# Patient Record
Sex: Female | Born: 1965 | Race: White | Hispanic: No | Marital: Married | Smoking: Current every day smoker
Health system: Southern US, Community
[De-identification: ages and names within clinical notes are randomized; demographics above are authoritative.]

## PROBLEM LIST (undated history)

## (undated) DIAGNOSIS — R9389 Abnormal findings on diagnostic imaging of other specified body structures: Secondary | ICD-10-CM

## (undated) DIAGNOSIS — E559 Vitamin D deficiency, unspecified: Secondary | ICD-10-CM

## (undated) DIAGNOSIS — I10 Essential (primary) hypertension: Secondary | ICD-10-CM

## (undated) DIAGNOSIS — J189 Pneumonia, unspecified organism: Secondary | ICD-10-CM

## (undated) DIAGNOSIS — R739 Hyperglycemia, unspecified: Secondary | ICD-10-CM

## (undated) DIAGNOSIS — F329 Major depressive disorder, single episode, unspecified: Secondary | ICD-10-CM

## (undated) DIAGNOSIS — R399 Unspecified symptoms and signs involving the genitourinary system: Secondary | ICD-10-CM

## (undated) DIAGNOSIS — Z20822 Contact with and (suspected) exposure to covid-19: Secondary | ICD-10-CM

## (undated) DIAGNOSIS — F418 Other specified anxiety disorders: Secondary | ICD-10-CM

## (undated) DIAGNOSIS — M47812 Spondylosis without myelopathy or radiculopathy, cervical region: Secondary | ICD-10-CM

## (undated) DIAGNOSIS — R079 Chest pain, unspecified: Secondary | ICD-10-CM

## (undated) DIAGNOSIS — M25551 Pain in right hip: Secondary | ICD-10-CM

## (undated) DIAGNOSIS — B001 Herpesviral vesicular dermatitis: Secondary | ICD-10-CM

## (undated) DIAGNOSIS — N390 Urinary tract infection, site not specified: Secondary | ICD-10-CM

## (undated) DIAGNOSIS — Z780 Asymptomatic menopausal state: Secondary | ICD-10-CM

## (undated) DIAGNOSIS — Z720 Tobacco use: Secondary | ICD-10-CM

## (undated) DIAGNOSIS — M5414 Radiculopathy, thoracic region: Secondary | ICD-10-CM

## (undated) DIAGNOSIS — I38 Endocarditis, valve unspecified: Secondary | ICD-10-CM

## (undated) DIAGNOSIS — R52 Pain, unspecified: Secondary | ICD-10-CM

## (undated) DIAGNOSIS — M1990 Unspecified osteoarthritis, unspecified site: Secondary | ICD-10-CM

## (undated) DIAGNOSIS — F32A Depression, unspecified: Secondary | ICD-10-CM

## (undated) HISTORY — DX: Essential (primary) hypertension: I10

## (undated) HISTORY — PX: HIP SURGERY: SHX245

## (undated) HISTORY — PX: VAGINAL HYSTERECTOMY: SUR661

## (undated) HISTORY — DX: Depression, unspecified: F32.A

## (undated) HISTORY — DX: Unspecified osteoarthritis, unspecified site: M19.90

## (undated) HISTORY — PX: ELBOW SURGERY: SHX618

---

## 2006-05-14 ENCOUNTER — Emergency Department (HOSPITAL_COMMUNITY): Admission: EM | Admit: 2006-05-14 | Discharge: 2006-05-14 | Payer: Self-pay | Admitting: Emergency Medicine

## 2006-07-26 ENCOUNTER — Encounter (INDEPENDENT_AMBULATORY_CARE_PROVIDER_SITE_OTHER): Payer: Self-pay | Admitting: Specialist

## 2006-07-26 ENCOUNTER — Ambulatory Visit (HOSPITAL_COMMUNITY): Admission: RE | Admit: 2006-07-26 | Discharge: 2006-07-26 | Payer: Self-pay | Admitting: Obstetrics and Gynecology

## 2009-06-30 ENCOUNTER — Encounter (INDEPENDENT_AMBULATORY_CARE_PROVIDER_SITE_OTHER): Payer: Self-pay | Admitting: Obstetrics and Gynecology

## 2009-06-30 ENCOUNTER — Inpatient Hospital Stay (HOSPITAL_COMMUNITY): Admission: RE | Admit: 2009-06-30 | Discharge: 2009-07-02 | Payer: Self-pay | Admitting: Obstetrics and Gynecology

## 2010-05-12 ENCOUNTER — Encounter
Admission: RE | Admit: 2010-05-12 | Discharge: 2010-05-12 | Payer: Self-pay | Source: Home / Self Care | Attending: Family Medicine | Admitting: Family Medicine

## 2010-08-02 LAB — COMPREHENSIVE METABOLIC PANEL
AST: 18 U/L (ref 0–37)
BUN: 8 mg/dL (ref 6–23)
Chloride: 108 mEq/L (ref 96–112)
GFR calc Af Amer: >60 mL/min (ref >60)
GFR calc non Af Amer: >60 mL/min (ref >60)
Potassium: 4.2 mEq/L (ref 3.5–5.1)
Sodium: 140 mEq/L (ref 135–145)
Total Bilirubin: 0.5 mg/dL (ref 0.3–1.2)
Total Protein: 7.4 g/dL (ref 6.0–8.3)

## 2010-08-02 LAB — CBC
HCT: 30.9 % — ABNORMAL LOW (ref 36.0–46.0)
Hemoglobin: 14 g/dL (ref 12.0–15.0)
MCHC: 34.2 g/dL (ref 30.0–36.0)
MCHC: 34.5 g/dL (ref 30.0–36.0)
MCV: 98.7 fL (ref 78.0–100.0)
Platelets: 294 10*3/uL (ref 150–400)
Platelets: 360 10*3/uL (ref 150–400)
RDW: 12.1 % (ref 11.5–15.5)
RDW: 12.6 % (ref 11.5–15.5)
WBC: 8.8 10*3/uL (ref 4.0–10.5)

## 2010-08-02 LAB — HEMOGLOBIN AND HEMATOCRIT, BLOOD
HCT: 34.5 % — ABNORMAL LOW (ref 36.0–46.0)
Hemoglobin: 11.6 g/dL — ABNORMAL LOW (ref 12.0–15.0)
Hemoglobin: 13.6 g/dL (ref 12.0–15.0)

## 2010-08-02 LAB — URINALYSIS, ROUTINE W REFLEX MICROSCOPIC
Bilirubin Urine: NEGATIVE
Hgb urine dipstick: NEGATIVE
Specific Gravity, Urine: 1.02 (ref 1.005–1.030)
pH: 6 (ref 5.0–8.0)

## 2010-08-02 LAB — PREGNANCY, URINE: Preg Test, Ur: NEGATIVE

## 2010-08-02 LAB — PROTIME-INR: Prothrombin Time: 12.2 seconds (ref 11.6–15.2)

## 2010-09-29 NOTE — Op Note (Signed)
NAMEFERNANDE, Autumn Murray NO.:  [id number]   MEDICAL RECORD NO.:  [id number]          PATIENT TYPE:  AMB   LOCATION:  SDC                           FACILITY:  [facility]   PHYSICIAN:  Miguel Aschoff, M.D.       DATE OF BIRTH:  1965/12/23   DATE OF PROCEDURE:  07/26/2006  DATE OF DISCHARGE:                               OPERATIVE REPORT   PREOPERATIVE DIAGNOSES:  1. Chronic right lower quadrant pain.  2. Complex right adnexal mass.   POSTOPERATIVE DIAGNOSES:  1. Chronic right lower quadrant pain.  2. Complex right adnexal mass.  3. Multiple pelvic adhesions.   PROCEDURES:  1. Diagnostic laparoscopy.  2. Lysis of adhesions.  3. Right salpingo-oophorectomy.  4. Removal of Filshie clips.   SURGEON:  Miguel Aschoff, MD   ASSISTANT:  Freddrick March. Tenny Craw, MD   ANESTHESIA:  General.   COMPLICATIONS:  None.   JUSTIFICATION:  The patient is a 45 year old white female who has a  history of persistent pelvic pain.  The patient previously underwent a  right salpingectomy in an effort to control her pain, however has had  continued symptoms without relief.  She is noted to have right hip pain,  felt not to be associated with the problem in the pelvis.  Ultrasound  examination has been carried out on this patient and reveals what  appears to be a complex right adnexal mass involving the ovary with  calcifications and suspected to be a dermoid.  She presents now to  undergo laparoscopy and possible laparotomy to assess the etiology of  this pain and correct it.  The risks and benefits have been discussed  with the patient.   PROCEDURE:  The patient was taken to the operating room and placed in a  supine position and general anesthesia was administered without  difficulty.  She is then placed in the dorsal lithotomy position,  prepped and draped in the usual sterile fashion.  After this was done,  the bladder was catheterized and a Hulka tenaculum was placed through  the cervix and  held.  At this point a small infraumbilical incision was  made, a Veress needle was inserted, and the abdomen was insufflated with  3 L of CO2.  Following this the trocar to the laparoscope was placed,  followed by the laparoscope itself.  Then two accessory were ports were  established in the right and left lower quadrants under direct  visualization.  After this was done, systematic inspection of the pelvis  and abdomen was carried out.  The anterior bladder peritoneum was  unremarkable.  The uterus was normal size and shape.  Its surface was  unremarkable.  The cul-de-sac was unremarkable.  On the left side the  residual portion of the left tube appeared be within normal limits  proximally.  A Filshie clip was noted to be proximal.  The left ovary  appeared to be totally within normal limits.  No adhesions were noted.  On the right side, however, there was a complex mass on the right  involving the right ovary to the appendices epiploica  of the sigmoid  colon.  Placing the appendices epiploica on stretch, using sharp  dissection it was possible to free these off the surface of the ovary.  Then using the same careful dissection, it was possible to free the  ovary off the right lateral pelvic sidewall.  After this was done, the  Gyrus unit was introduced and the pedicle holding the right ovary was  then grasped, cauterized and cut, freeing the specimen.  There was good  hemostasis in the area this dissection.  The ovary was then placed in  the cul-de-sac.  In an effort to remove any other source of pain, the  previously-placed Filshie clips were grasped, elevated, and then using  the Gyrus unit the tissue below these clips was cauterized and cut and  the clips were removed.  Specimens were then placed in the cul-de-sac.  The right lower quadrant 5 mm port was enlarged 11 mm.  An EndoCatch bag  was placed and the specimen of the right ovary and the two Filshie clips  was placed in the  EndoCatch bag and removed through the right lower  quadrant.  At this point inspection made for hemostasis.  Hemostasis  appeared to be excellent.  With no other problems being noted, it was  elected to complete the procedure.  The CO2 was allowed to escape.  11  mm port site was then identified, the fascia grasped and closed using  figure-of-eight sutures of 0 Vicryl.  Subcutaneous tissue in this area  was closed using a single suture of 0 Vicryl and then the residual port  sites were closed using subcuticular 4-0 Vicryl.  Steri-Strips were  applied.  Port sites were then injected 0.25% Marcaine.  A total of 10  mL were used.   The patient tolerated the procedure well and was taken to the recovery  room in satisfactory condition.  The blood loss was minimal.  Plan is  for the patient be discharged home.  Medications for home include  Vicodin one every 3 hours need for pain.  She is to continue her  sertraline 50 mg daily and add naproxen as needed.  She is instructed no  heavy lifting, to refrain from sex for 2 weeks.  The patient will be  seen back in 4 weeks for follow-up examination and is to call for any  problems such as fever, pain or heavy bleeding.      Miguel Aschoff, M.D.  Electronically Signed     AR/MEDQ  D:  07/26/2006  T:  07/26/2006  Job:  [id number]

## 2015-02-01 ENCOUNTER — Encounter: Payer: BLUE CROSS/BLUE SHIELD | Attending: Orthopaedic Surgery of the Spine

## 2015-03-17 ENCOUNTER — Ambulatory Visit: Admit: 2015-03-17 | Discharge: 2015-03-25 | Payer: BLUE CROSS/BLUE SHIELD | Attending: Surgical

## 2015-03-17 DIAGNOSIS — M5136 Other intervertebral disc degeneration, lumbar region: Secondary | ICD-10-CM

## 2015-03-17 NOTE — Progress Notes (Signed)
History of present illness:   Ms. Tanya Velasquez  is a pleasant 49 y.o. female with no significant PMH kindly referred by Dr. Mirian Mowight Mosley for consultation regarding her LBP and right leg pain. She states her pain began insidiously about 10  years ago. Her pain has steadily increased since then. She rates her back pain 5/10 and leg pain 3/10. She describes the pain as aching. Her pain originates in her low back and intermittently radiates into the lateral aspect of her right leg to her knee.  She denies numbness and tingling in her legs.  She denies weakness of her legs and denies bowel or bladder dysfunction. She states she can sit as long as she likes and stand for a maximum of 1 hour. The pain moderately disrupts her sleep. She has tried pain management, epidural injections, RFA, PT and chiropractic therapy with temporary relief.  She takes Percocet, NSAIDs and Flexeril. [place] and notes her previous injections were 1 year ago by her PM physician there. She has not tried any injections with Dr. Excell SeltzerMosley.    Past medical history:  Her past medical history has been reviewed.    Past surgical history:  Her past surgical history has been reviewed and is non-contributory to her present illness.    Her medications and allergies were reviewed.    Social history:  Her social history has been reviewed and she smokes 1/2 ppd for 20 years.      Review of symptoms:  Patient's review of symptoms was reviewed and is significant for back pain and negative for recent weight loss, fatigue, chills, visual disturbances, blood in stool or urine, recent infection, chest pain, or shortness of breath. All other ROS were negative.    Physical examination:  Ms. Tanya Velasquez's most recent vitals:  Vitals  Height: 5\' 5"  (165.1 cm)  Weight: 155 lb (70.3 kg)  Body mass index is 25.79 kg/(m^2).    General exam:  She is well-developed and well-nourished, is in obvious pain and alert and oriented to person,  place, and time. She demonstrates appropriate mood and affect. Her skin is warm and dry. Her gait is normal and she walks heel to toe without limp or instability.     Back:  She stands with slight lumbar flexion. Her lumbar flexion, extension and lateral bending are moderately reduced with pain. She has mild tenderness over her lumbar spine without obvious muscle spasm. The skin over her lumbar spine is normal without a surgical scar.     Lower extremities:  She has 5/5 motor strength of bilateral lower extremities. She has a negative straight leg raise, bilaterally.  Deep tendon reflexes at knees and achilles are 2+. Sensation is intact to light touch L3 to S1 bilaterally. She has no clonus. Hip range of motion painless.    Abdomen:  Non-tender and non-distended.  Abdomen is not obese. No rash.    Imaging:  I reviewed MRI images of her lumbar spine from 10/2013 (report not available). They note mild degenerative changes with small annular tear at L5S1 and small right lateral disc protrusion at L45. No severe stenosis or neural impingement noted.    Assessment:  Lumbar spondylosis    Plan:  We discussed treatment options including observation, physical therapy, additional epidural injections or nerve blocks.  I recommend she continue with pain management and periodic injections. I do not think spinal surgery is indicated at this time.    Note dictated by Tresa EndoKelly  Rochester Serpe PA-C, patient also seen and examined by Dr. Phineas Inches.

## 2015-07-22 ENCOUNTER — Inpatient Hospital Stay
Admit: 2015-07-22 | Discharge: 2015-07-22 | Disposition: A | Discharge status: Home / Self Care | Attending: Emergency Medicine

## 2015-07-22 DIAGNOSIS — F1193 Opioid use, unspecified with withdrawal: Secondary | ICD-10-CM

## 2015-07-22 MED ORDER — CLONIDINE HCL 0.1 MG PO TABS
0.1 MG | ORAL_TABLET | Freq: Two times a day (BID) | ORAL | 0 refills | Status: DC
Start: 2015-07-22 — End: 2017-12-05

## 2015-07-22 MED ORDER — PROMETHAZINE HCL 25 MG PO TABS
25 MG | ORAL_TABLET | ORAL | 0 refills | Status: AC
Start: 2015-07-22 — End: 2015-07-27

## 2015-07-22 MED ORDER — CLONIDINE HCL 0.1 MG PO TABS
0.1 MG | Freq: Once | ORAL | Status: AC
Start: 2015-07-22 — End: 2015-07-22
  Administered 2015-07-22: 16:00:00 0.1 mg via ORAL

## 2015-07-22 MED FILL — CLONIDINE HCL 0.1 MG PO TABS: 0.1 MG | ORAL | Qty: 1

## 2015-07-22 NOTE — ED Provider Notes (Signed)
HPI:  07/22/15, Time: 9:32 AM         Ellis SavageColleen Thall is a 50 y.o. female presenting to the ED for opiate withdrawal, beginning 1 day ago.  The complaint has been persistent, moderate in severity, and worsened by nothing.  Patient states she moved to [place] several months ago she is in pain management. She states they recently decreased her pain medication and she is now out she last took a dose of narcotics 1 day ago. She is on 15 mg 4 times a day of oxycodone. She states she has some mild nausea. No diarrhea no fever no chills. She's feeling mildly anxious. There is no depression. No suicidal nor homicidal ideation.    ROS:   Pertinent positives and negatives are stated within HPI, all other systems reviewed and are negative.  --------------------------------------------- PAST HISTORY ---------------------------------------------  Past Medical History:  has a past medical history of Hypertension.    Past Surgical History:  has no past surgical history on file.    Social History:  reports that she has been smoking Cigarettes.  She started smoking about 21 years ago. She has been smoking about 0.50 packs per day. She has never used smokeless tobacco. She reports that she drinks alcohol.    Family History: family history is not on file.     The patient???s home medications have been reviewed.    Allergies: Review of patient's allergies indicates no known allergies.    ---------------------------------------------------PHYSICAL EXAM--------------------------------------     Constitutional/General: Alert and oriented x3, well appearing, non toxic in NAD  Head: Normocephalic and atraumatic  Eyes: PERRL, EOMI  Mouth: Oropharynx clear, handling secretions, no trismus  Neck: Supple, full ROM, non tender to palpation in the midline, no stridor, no crepitus, no meningeal signs  Pulmonary: Lungs clear to auscultation bilaterally, no wheezes, rales, or rhonchi. Not in respiratory distress  Cardiovascular:  Tachycardic rate.  Regular rhythm. No murmurs, gallops, or rubs. 2+ distal pulses  Chest: no chest wall tenderness  Abdomen: Soft.  Non tender. Non distended.  +BS.  No rebound, guarding, or rigidity. No pulsatile masses appreciated.  Musculoskeletal: Moves all extremities x 4. Warm and well perfused, no clubbing, cyanosis, or edema. Capillary refill <3 seconds  Skin: warm and dry. No rashes.   Neurologic: GCS 15, CN 2-12 grossly intact, no focal deficits, symmetric strength 5/5 in the upper and lower extremities bilaterally  Psych: Normal Affect. No suicidal nor homicidal ideation.    -------------------------------------------------- RESULTS -------------------------------------------------  I have personally reviewed all laboratory and imaging results for this patient. Results are listed below.     LABS:  No results found for this visit on 07/22/15.    RADIOLOGY:  Interpreted by Radiologist.  No orders to display             ------------------------- NURSING NOTES AND VITALS REVIEWED ---------------------------   The nursing notes within the ED encounter and vital signs as below have been reviewed by myself.  Visit Vitals   ??? BP (!) 142/92   ??? Pulse 116   ??? Temp 97.9 ??F (36.6 ??C) (Oral)   ??? Resp 15   ??? Ht 5\' 6"  (1.676 m)   ??? Wt 155 lb (70.3 kg)   ??? SpO2 99%   ??? BMI 25.02 kg/m2     Oxygen Saturation Interpretation: Normal    The patient???s available past medical records and past encounters were reviewed.        ------------------------------ ED COURSE/MEDICAL DECISION MAKING----------------------  Medications  cloNIDine (CATAPRES) tablet 0.1 mg (not administered)             Medical Decision Making:    At present for the opiate withdrawal patient is not suicidal nor homicidal. I advised her that due to state laws I could not describe any narcotics she has appointment to see her pain management doctor Monday. I did place her on clonidine and Phenergan for 4 days until she can see her pain management doctor. I have ordered a clonidine  tablet to be administered at this time.            This patient's ED course included: re-evaluation prior to disposition and a personal history and physicial eaxmination    This patient has remained hemodynamically stable and improved during their ED course.    Counseling:   The emergency provider has spoken with the patient and discussed today???s results, in addition to providing specific details for the plan of care and counseling regarding the diagnosis and prognosis.  Questions are answered at this time and they are agreeable with the plan.       --------------------------------- IMPRESSION AND DISPOSITION ---------------------------------    IMPRESSION  1. Opiate withdrawal (HCC)        DISPOSITION  Disposition: Discharge to home  Patient condition is good        NOTE: This report was transcribed using voice recognition software. Every effort was made to ensure accuracy; however, inadvertent computerized transcription errors may be present         Nuala Alpha, MD  07/22/15 [phone]

## 2017-12-05 ENCOUNTER — Ambulatory Visit: Admit: 2017-12-05 | Discharge: 2017-12-05 | Payer: BLUE CROSS/BLUE SHIELD | Attending: Registered Nurse

## 2017-12-05 DIAGNOSIS — Z7689 Persons encountering health services in other specified circumstances: Secondary | ICD-10-CM

## 2017-12-05 MED ORDER — VALACYCLOVIR HCL 1 G PO TABS
1 g | ORAL_TABLET | ORAL | 1 refills | Status: DC
Start: 2017-12-05 — End: 2018-11-25

## 2017-12-05 MED ORDER — DICLOFENAC SODIUM 75 MG PO TBEC
75 MG | ORAL_TABLET | Freq: Two times a day (BID) | ORAL | 5 refills | Status: DC
Start: 2017-12-05 — End: 2018-06-02

## 2017-12-05 MED ORDER — ESCITALOPRAM OXALATE 10 MG PO TABS
10 MG | ORAL_TABLET | Freq: Every day | ORAL | 5 refills | Status: DC
Start: 2017-12-05 — End: 2018-01-30

## 2017-12-05 MED ORDER — KETOROLAC TROMETHAMINE 60 MG/2ML IM SOLN
60 MG/2ML | Freq: Once | INTRAMUSCULAR | Status: AC
Start: 2017-12-05 — End: 2017-12-05
  Administered 2017-12-05: 14:00:00 60 mg via INTRAMUSCULAR

## 2017-12-05 NOTE — Progress Notes (Signed)
[facility]  [facility]  [address]  [place] [facility] [address]  Dept: [phone]  Dept Fax: [phone]  Loc: [phone]    Tanya Velasquez is a 52 y.o. female who presents today for her medical conditions/complaints as noted below.  Tanya Velasquez is c/o of Establish Care (pt is here to establish care.)        HPI:     Chief Complaint   Patient presents with   ??? Establish Care     pt is here to establish care.       HPI    Tanya Velasquez presents to the office to establish care.  Tanya Velasquez states Tanya Velasquez has not had a primary care provider for several years.  Tanya Velasquez use to live in [place] and moved to [place] several years ago and never saw a PCP yet.      Tanya Velasquez has a PMH of depression, tobacco abuse, chronic back pain and right hip pain.  Tanya Velasquez has an intrathecal pain pump in her back.  Tanya Velasquez sees Dr. Cline Crock for chronic pain. Tanya Velasquez is not on any medications for her high blood pressure.     Tanya Velasquez is interested in getting a mammogram.  Tanya Velasquez wants a 3D mammogram as Tanya Velasquez states Tanya Velasquez has been told from previous mammograms Tanya Velasquez has dense breast.  Her mother had breast cancer and died at the age of 89 from it.  Her sister is battling breast cancer now at the age of 61.        Tanya Velasquez is currently having thoracic back and neck pain.  Tanya Velasquez has had this for several months and has been taking ibuprofen.  Tanya Velasquez states the ibuprofen does not seem to help much.  Tanya Velasquez states the pain is dull and aching and when Tanya Velasquez moves it is sharp and shooting.  Tanya Velasquez has not had any x-rays performed.  No PT/OT.      Tanya Velasquez is a smoker. Tanya Velasquez smokes half a pack a day. Tanya Velasquez states Tanya Velasquez will stop smoking, but Tanya Velasquez is not interested at this time.    States that Tanya Velasquez feels like her depression is not where it should be. States with her sister recently having cancer and her husband recently having cancer, her depression worse. Tanya Velasquez feels anxious frequently. Tanya Velasquez states Tanya Velasquez is to be on Effexor and this seemed to help her a lot. Tanya Velasquez states that the Effexor seemed  to wear off though. Tanya Velasquez denies any thoughts of hurting herself or others. Tanya Velasquez admits to having constant racing thoughts in her head. Tanya Velasquez is always tired. Tanya Velasquez is wanting to try something for her depression and anxiety.    Tanya Velasquez admits to getting cold sores on her lips frequently. Tanya Velasquez notices more when Tanya Velasquez is stressed. Tanya Velasquez is asking for something to help with her cold sores when Tanya Velasquez gets them. Tanya Velasquez's tried Abreva without success.    Past Medical History:   Diagnosis Date   ??? Chronic back pain    ??? Chronic hip pain 2009   ??? Depression    ??? Osteoarthritis    ??? Substance abuse (HCC)     Tobacco abuse       Past Surgical History:   Procedure Laterality Date   ??? BACK SURGERY  05/2017    pt has an intrathecal pain pump implanted in her back.    ??? ELBOW SURGERY Left     Childhood    ??? HYSTERECTOMY, VAGINAL     ??? TOTAL HIP ARTHROPLASTY  Right 2009       Family History   Problem Relation Age of Onset   ??? Breast Cancer Mother    ??? Heart Attack Father    ??? High Blood Pressure Father    ??? High Cholesterol Father    ??? Breast Cancer Sister        Social History     Tobacco Use   ??? Smoking status: Current Every Day Smoker     Packs/day: 0.50     Years: 23.00     Pack years: 11.50     Types: Cigarettes     Start date: 05/14/1994   ??? Smokeless tobacco: Never Used   Substance Use Topics   ??? Alcohol use: Yes     Alcohol/week: 12.0 standard drinks     Types: 12 Cans of beer per week     Comment: occ         Current Outpatient Medications   Medication Sig Dispense Refill   ??? sodium chloride 0.9 % SOLN with HYDROmorphone HCl PF 50 MG/5ML SOLN 0.2 mg/mL Infuse 0.5 mg/hr intravenously continuous. Indications: pt has pain pump     ??? escitalopram (LEXAPRO) 10 MG tablet Take 1 tablet by mouth daily 30 tablet 5   ??? diclofenac (VOLTAREN) 75 MG EC tablet Take 1 tablet by mouth 2 times daily 60 tablet 5   ??? valACYclovir (VALTREX) 1 g tablet Take 2 tablets in the AM and PM once at the onset of a cold sore 30 tablet 1   ??? cyclobenzaprine (FLEXERIL) 10 MG  tablet   2     No current facility-administered medications for this visit.      Allergies   Allergen Reactions   ??? Gabapentin      Other reaction(s): Headaches       Subjective:      Review of Systems   Constitutional: Positive for fatigue. Negative for activity change, appetite change, chills, diaphoresis, fever and unexpected weight change.   HENT: Negative.  Negative for ear pain, rhinorrhea, sinus pressure, sneezing, sore throat and trouble swallowing.    Eyes: Negative for photophobia, pain, discharge, redness, itching and visual disturbance.   Respiratory: Negative.  Negative for apnea, cough, choking, chest tightness, shortness of breath, wheezing and stridor.    Cardiovascular: Negative for chest pain, palpitations and leg swelling.   Gastrointestinal: Negative.  Negative for abdominal pain, blood in stool, constipation, diarrhea, nausea and vomiting.   Genitourinary: Negative.  Negative for decreased urine volume, difficulty urinating, dysuria, enuresis, flank pain, frequency, genital sores, hematuria and urgency.   Musculoskeletal: Positive for arthralgias, back pain, myalgias, neck pain and neck stiffness. Negative for gait problem and joint swelling.   Skin: Negative.  Negative for color change, pallor, rash and wound.   Allergic/Immunologic: Negative.    Neurological: Negative for dizziness, facial asymmetry, weakness, light-headedness and headaches.   Psychiatric/Behavioral: Positive for dysphoric mood. Negative for agitation, behavioral problems, confusion, decreased concentration, hallucinations, self-injury, sleep disturbance and suicidal ideas. The patient is nervous/anxious. The patient is not hyperactive.          Objective:     Vitals:    12/05/17 0824 12/05/17 0828 12/05/17 0833   BP: (!) 132/94 (!) 142/96 128/89   Site: Right Upper Arm Right Upper Arm Right Upper Arm   Position: Sitting Sitting Sitting   Cuff Size: Medium Adult Medium Adult Medium Adult   Pulse: 102     SpO2: 98%     Weight:  178  lb (80.7 kg)     Height: 5\' 6"  (1.676 m)       Wt Readings from Last 3 Encounters:   12/05/17 178 lb (80.7 kg)   07/22/15 155 lb (70.3 kg)   03/17/15 155 lb (70.3 kg)     Temp Readings from Last 3 Encounters:   07/22/15 97.9 ??F (36.6 ??C) (Oral)     BP Readings from Last 3 Encounters:   12/05/17 128/89   07/22/15 (!) 142/92   03/17/15 (!) 152/91     Pulse Readings from Last 3 Encounters:   12/05/17 102   07/22/15 116   03/17/15 104     Physical Exam   Constitutional: Tanya Velasquez is oriented to person, place, and time. Tanya Velasquez appears well-developed and well-nourished. No distress.   HENT:   Head: Normocephalic and atraumatic.       Right Ear: External ear normal.   Left Ear: External ear normal.   Nose: Nose normal.   Mouth/Throat: Oropharynx is clear and moist. No oropharyngeal exudate.   Eyes: Conjunctivae and EOM are normal. Right eye exhibits no discharge. Left eye exhibits no discharge. No scleral icterus.   Neck: Normal range of motion. Neck supple. No JVD present. No tracheal deviation present. No thyromegaly present.   Cardiovascular: Normal rate, regular rhythm, normal heart sounds and intact distal pulses. Exam reveals no gallop and no friction rub.   No murmur heard.  Pulmonary/Chest: Effort normal and breath sounds normal. No stridor. No respiratory distress. Tanya Velasquez has no wheezes. Tanya Velasquez has no rales. Tanya Velasquez exhibits no tenderness.   Abdominal: Soft. Bowel sounds are normal. Tanya Velasquez exhibits no distension and no mass. There is no tenderness. There is no rebound and no guarding. No hernia.   Musculoskeletal: Tanya Velasquez exhibits no edema or deformity.        Right hip: Tanya Velasquez exhibits decreased range of motion, decreased strength and tenderness.        Lumbar back: Tanya Velasquez exhibits decreased range of motion, tenderness and pain.   Lymphadenopathy:     Tanya Velasquez has no cervical adenopathy.   Neurological: Tanya Velasquez is alert and oriented to person, place, and time. Tanya Velasquez has normal reflexes. Tanya Velasquez displays normal reflexes. No cranial nerve deficit. Tanya Velasquez  exhibits normal muscle tone. Coordination normal.   Skin: Skin is warm and dry. Capillary refill takes less than 2 seconds. No rash noted. Tanya Velasquez is not diaphoretic. No erythema. No pallor.   Psychiatric: Her behavior is normal. Judgment and thought content normal. Her speech is tangential. Tanya Velasquez exhibits a depressed mood.   Nursing note and vitals reviewed.      No results found for any previous visit.           Assessment & Plan:     The following diagnoses and conditions are stable with no further orders unless indicated:  1. Encounter to establish care    2. Chronic bilateral thoracic back pain    3. Right hip pain    4. Chronic bilateral low back pain with right-sided sciatica    5. Reactive depression    6. Cold sore    7. Screening for disorder of blood and blood-forming organs    8. Encounter for vitamin deficiency screening    9. Screening for cholesterol level    10. Screening for thyroid disorder    11. Screening for HIV (human immunodeficiency virus)    12. Need for hepatitis C screening test    13. Screen for colon cancer    14. Screening for  breast cancer    15. Need for Tdap vaccination    16. Chronic pain syndrome    17. Family history of breast cancer    18. Neck pain        Tanya Velasquez was seen today for establish care.    Toradol injection given today for her thoracic back pain, right hip pain, and neck pain.  Xrays ordered for her neck and thoracic back.  Tanya Velasquez is not to take any NSAIDS for the next 48 hours.  Tanya Velasquez may start on Diclofenac for her pain.  Tanya Velasquez is not to take any NSAIDS other than Diclofenac. Tanya Velasquez most likely will need a referral to orthopedic and PT/OT.    Valtrex prescribed for cold sores Tanya Velasquez may take at the onset.      Order placed for mammogram.  Recommend genetic counseling to [facility].  Referral given.      Lexapro given for depression and anxiety.  Educated her on Lexapro and depression and anxiety.     Diagnoses and all orders for this visit:    Encounter to establish care    Chronic bilateral  thoracic back pain  -     XR THORACIC SPINE (2 VIEWS); Future    Right hip pain  -     diclofenac (VOLTAREN) 75 MG EC tablet; Take 1 tablet by mouth 2 times daily  -     ketorolac (TORADOL) injection 60 mg    Chronic bilateral low back pain with right-sided sciatica  -     diclofenac (VOLTAREN) 75 MG EC tablet; Take 1 tablet by mouth 2 times daily  -     ketorolac (TORADOL) injection 60 mg    Reactive depression  -     escitalopram (LEXAPRO) 10 MG tablet; Take 1 tablet by mouth daily    Cold sore  -     valACYclovir (VALTREX) 1 g tablet; Take 2 tablets in the AM and PM once at the onset of a cold sore    Screening for disorder of blood and blood-forming organs  -     CBC Auto Differential; Future  -     Comprehensive Metabolic Panel; Future    Encounter for vitamin deficiency screening  -     Vitamin D 25 Hydroxy; Future  -     Vitamin B12 & Folate; Future    Screening for cholesterol level  -     Lipid Panel; Future    Screening for thyroid disorder  -     TSH with Reflex; Future    Screening for HIV (human immunodeficiency virus)  -     HIV Screen; Future    Need for hepatitis C screening test  -     Hepatitis C Antibody; Future    Screen for colon cancer  -     [facility]old Harms, MD, Gastro[facility], [place]    Screening for breast cancer  -     MAM TOMO DIGITAL SCREEN BILATERAL; Future    Need for Tdap vaccination  -     Tdap (age 72y and older) IM (Boostrix)    Chronic pain syndrome    Family history of breast cancer  -     External Referral To Genetics    Neck pain  -     XR CERVICAL SPINE (4-5 VIEWS); Future      Prior to Visit Medications    Medication Sig Taking? Authorizing Provider   sodium chloride 0.9 % SOLN with HYDROmorphone HCl PF  50 MG/5ML SOLN 0.2 mg/mL Infuse 0.5 mg/hr intravenously continuous. Indications: pt has pain pump Yes Historical Provider, MD   escitalopram (LEXAPRO) 10 MG tablet Take 1 tablet by mouth daily Yes Dierdre Harnessatherine D Shaunee Mulkern, APRN - CNP   diclofenac (VOLTAREN) 75  MG EC tablet Take 1 tablet by mouth 2 times daily Yes Dierdre Harnessatherine D Shadoe Cryan, APRN - CNP   valACYclovir (VALTREX) 1 g tablet Take 2 tablets in the AM and PM once at the onset of a cold sore Yes Dierdre Harnessatherine D Prudencio Velazco, APRN - CNP   cyclobenzaprine (FLEXERIL) 10 MG tablet  Yes Historical Provider, MD        Orders Placed This Encounter   Medications   ??? escitalopram (LEXAPRO) 10 MG tablet     Sig: Take 1 tablet by mouth daily     Dispense:  30 tablet     Refill:  5   ??? diclofenac (VOLTAREN) 75 MG EC tablet     Sig: Take 1 tablet by mouth 2 times daily     Dispense:  60 tablet     Refill:  5   ??? valACYclovir (VALTREX) 1 g tablet     Sig: Take 2 tablets in the AM and PM once at the onset of a cold sore     Dispense:  30 tablet     Refill:  1   ??? ketorolac (TORADOL) injection 60 mg         Return in about 8 weeks (around 01/30/2018) for BP recheck, discuss blood pressure .    Patient should call the office immediately with new or ongoing signs or symptoms or worsening, or proceedto the emergency room.  No changes in past medical history, past surgical history, social history, or family history were noted during the patient encounter unless specifically listed above.  All updates of past medicalhistory, past surgical history, social history, or family history were reviewed personally by me during the office visit.  All problems listed in the assessment are stable unless noted otherwise.  Medication profilereviewed personally by me during the office visit.  Medication side effects and possible impairments from medications were discussed as applicable.  ??  Call if pattern of symptoms change or persists for an extended time.    This document was prepared by a combination of typing and transcription through a voice recognition software.    This provider spent 60 minutes in the room with the patient with 50% or greater being utilized on patient education.  Patient educated on Lexapro, depression, genetic testing, mammograms,  colonoscopy, tobacco cessation, NSAIDS.     The patient was advised that NSAID-type medications have two very important potential side effects: gastrointestinal irritation including hemorrhage and renal injuries. Tanya Velasquez was asked to take the medication with food and to stop if Tanya Velasquez experiences any GI upset. I asked her to call for vomiting, abdominal pain or black/bloody stools. The patient expresses understanding of these issues and questions were answered.    I've explained to her that drugs of the SSRI class can have side effects such as weight gain, sexual dysfunction, insomnia, headache, nausea. These medications are generally effective at alleviating symptoms of anxiety and/or depression. Let me know if significant side effects do occur.    See someone right away if you want to hurt or kill yourself! -- If you ever feel like you might hurt yourself or someone else, do one of these things:  ?Call your doctor or nurse and tell them it is urgent  ?  Call for an ambulance (in the [place] and [place], dial 9-1-1)  ?Go to the emergency room at your local hospital  ?Call the National Suicide Prevention Lifeline:  ???848-571-6288    Tobacco abuse: Patient was counseled about stopping tobacco use. Discussed harmful effects of continued tobacco use including COPD, cardiovascular disease, and/or death. Discussed the increased risk of pneumonia as well as the potential for substantial decline in lung function. The following recommendations were given to improve chances of quitting:  ??  1)                   Chances of stopping improve with each attempt  ??  2)                   Encourage all other occupants in the house to quit  ??  3)                   Remove all tobacco products from home, work, and vehicles  ??  4)                   Tobacco cessation aids such as nicotine replacement therapy options    It is very important that Tanya Velasquez quit smoking. There are various alternatives available to help with this difficult task, but first and  foremost, Tanya Velasquez must make a firm commitment and decision to quit. The nature of nicotine addiction is discussed. The usefulness of behavioral therapy is discussed and suggested.  The correct use, cost and side effects of nicotine replacement therapy such as gum or patches is discussed. Bupropion and its cost (sometimes not covered fully by insurance) and side effects are reviewed. The quit rates are discussed. I recommend Tanya Velasquez not allow potential costs of treatment to deter her from using nicotine replacement therapy or bupropion, as the long term economic and health benefits are obvious.

## 2017-12-10 ENCOUNTER — Encounter: Admit: 2017-12-10 | Discharge: 2017-12-10 | Payer: BLUE CROSS/BLUE SHIELD

## 2017-12-10 LAB — CBC WITH AUTO DIFFERENTIAL
Basophils %: 1.4 %
Basophils Absolute: 0.1 10*3/uL (ref 0.0–0.2)
Eosinophils %: 4 %
Eosinophils Absolute: 0.3 10*3/uL (ref 0.0–0.6)
Hematocrit: 40.1 % (ref 36.0–48.0)
Hemoglobin: 13.6 g/dL (ref 12.0–16.0)
Lymphocytes %: 34.5 %
Lymphocytes Absolute: 3 10*3/uL (ref 1.0–5.1)
MCH: 33 pg (ref 26.0–34.0)
MCHC: 34.1 g/dL (ref 31.0–36.0)
MCV: 96.9 fL (ref 80.0–100.0)
MPV: 7.6 fL (ref 5.0–10.5)
Monocytes %: 8.6 %
Monocytes Absolute: 0.8 10*3/uL (ref 0.0–1.3)
Neutrophils %: 51.5 %
Neutrophils Absolute: 4.5 10*3/uL (ref 1.7–7.7)
Platelets: 468 10*3/uL — ABNORMAL HIGH (ref 135–450)
RBC: 4.13 M/uL (ref 4.00–5.20)
RDW: 13.6 % (ref 12.4–15.4)
WBC: 8.7 10*3/uL (ref 4.0–11.0)

## 2017-12-11 LAB — VITAMIN B12 & FOLATE
Folate: 9.55 ng/mL (ref 4.78–24.20)
Vitamin B-12: 560 pg/mL (ref 211–911)

## 2017-12-11 LAB — COMPREHENSIVE METABOLIC PANEL
ALT: 35 U/L (ref 10–40)
AST: 23 U/L (ref 15–37)
Albumin/Globulin Ratio: 1.8 (ref 1.1–2.2)
Albumin: 4.6 g/dL (ref 3.4–5.0)
Alkaline Phosphatase: 76 U/L (ref 40–129)
Anion Gap: 15 (ref 3–16)
BUN: 17 mg/dL (ref 7–20)
CO2: 24 mmol/L (ref 21–32)
Calcium: 9.7 mg/dL (ref 8.3–10.6)
Chloride: 104 mmol/L (ref 99–110)
Creatinine: 1 mg/dL (ref 0.6–1.1)
GFR African American: >60 (ref >60)
GFR Non-African American: 58 — AB (ref >60)
Globulin: 2.6 g/dL
Glucose: 87 mg/dL (ref 70–99)
Potassium: 4.7 mmol/L (ref 3.5–5.1)
Sodium: 143 mmol/L (ref 136–145)
Total Bilirubin: <0.2 mg/dL (ref 0.0–1.0)
Total Protein: 7.2 g/dL (ref 6.4–8.2)

## 2017-12-11 LAB — HIV SCREEN
HIV ANTIGEN: NONREACTIVE
HIV Ag/Ab: NONREACTIVE
HIV-1 Antibody: NONREACTIVE
HIV-2 Ab: NONREACTIVE

## 2017-12-11 LAB — LIPID PANEL
Cholesterol, Total: 255 mg/dL — ABNORMAL HIGH (ref 0–199)
HDL: 102 mg/dL — ABNORMAL HIGH (ref 40–60)
LDL Calculated: 131 mg/dL — ABNORMAL HIGH (ref <100)
Triglycerides: 112 mg/dL (ref 0–150)
VLDL Cholesterol Calculated: 22 mg/dL

## 2017-12-11 LAB — HEPATITIS C ANTIBODY: Hep C Ab Interp: NONREACTIVE

## 2017-12-11 LAB — VITAMIN D 25 HYDROXY: Vit D, 25-Hydroxy: 31.8 ng/mL (ref >=30)

## 2017-12-11 LAB — TSH WITH REFLEX: TSH: 3.68 u[IU]/mL (ref 0.27–4.20)

## 2017-12-18 NOTE — Telephone Encounter (Signed)
Pt called back and did not have information for [facility]. Information given. Pt is going to call.

## 2017-12-18 NOTE — Telephone Encounter (Signed)
-----   Message from Dierdre Harnessatherine D Carnahan, APRN - CNP sent at 12/05/2017  5:54 PM EDT -----  Please make sure patient was given referral for genetic counseling at [facility] in [place].

## 2018-01-07 ENCOUNTER — Inpatient Hospital Stay: Admit: 2018-01-07 | Payer: BLUE CROSS/BLUE SHIELD

## 2018-01-07 DIAGNOSIS — Z1231 Encounter for screening mammogram for malignant neoplasm of breast: Secondary | ICD-10-CM

## 2018-01-27 ENCOUNTER — Encounter

## 2018-01-27 NOTE — Telephone Encounter (Signed)
Pt was last seen on 12/05/17 for [facility]/ New pt

## 2018-01-27 NOTE — Telephone Encounter (Signed)
Medication should only be taken at the onset of a cold sore.  Should not need refills.

## 2018-01-30 ENCOUNTER — Ambulatory Visit: Admit: 2018-01-30 | Discharge: 2018-01-30 | Payer: BLUE CROSS/BLUE SHIELD | Attending: Registered Nurse

## 2018-01-30 DIAGNOSIS — F329 Major depressive disorder, single episode, unspecified: Secondary | ICD-10-CM

## 2018-01-30 LAB — BASIC METABOLIC PANEL
Anion Gap: 14 (ref 3–16)
BUN: 9 mg/dL (ref 7–20)
CO2: 24 mmol/L (ref 21–32)
Calcium: 9.9 mg/dL (ref 8.3–10.6)
Chloride: 104 mmol/L (ref 99–110)
Creatinine: 0.7 mg/dL (ref 0.6–1.1)
GFR African American: >60 (ref >60)
GFR Non-African American: >60 (ref >60)
Glucose: 74 mg/dL (ref 70–99)
Potassium: 5.1 mmol/L (ref 3.5–5.1)
Sodium: 142 mmol/L (ref 136–145)

## 2018-01-30 LAB — CBC WITH AUTO DIFFERENTIAL
Basophils %: 1.3 %
Basophils Absolute: 0.1 10*3/uL (ref 0.0–0.2)
Eosinophils %: 3.1 %
Eosinophils Absolute: 0.3 10*3/uL (ref 0.0–0.6)
Hematocrit: 41.5 % (ref 36.0–48.0)
Hemoglobin: 13.9 g/dL (ref 12.0–16.0)
Lymphocytes %: 28.9 %
Lymphocytes Absolute: 2.8 10*3/uL (ref 1.0–5.1)
MCH: 32.5 pg (ref 26.0–34.0)
MCHC: 33.4 g/dL (ref 31.0–36.0)
MCV: 97.2 fL (ref 80.0–100.0)
MPV: 7.8 fL (ref 5.0–10.5)
Monocytes %: 8.8 %
Monocytes Absolute: 0.8 10*3/uL (ref 0.0–1.3)
Neutrophils %: 57.9 %
Neutrophils Absolute: 5.6 10*3/uL (ref 1.7–7.7)
Platelets: 494 10*3/uL — ABNORMAL HIGH (ref 135–450)
RBC: 4.27 M/uL (ref 4.00–5.20)
RDW: 14.8 % (ref 12.4–15.4)
WBC: 9.6 10*3/uL (ref 4.0–11.0)

## 2018-01-30 MED ORDER — ESCITALOPRAM OXALATE 20 MG PO TABS
20 MG | ORAL_TABLET | Freq: Every day | ORAL | 5 refills | Status: DC
Start: 2018-01-30 — End: 2018-07-09

## 2018-01-30 NOTE — Progress Notes (Signed)
[facility]  [facility]  [address] [place] [address]  Dept: [phone]  Dept Fax: [phone]  Loc: [phone]    Tanya Velasquez is a 52 y.o. female who presents today for her medical conditions/complaints as noted below.  Tanya Velasquez is c/o of Hypertension (pt does not check bp at home. pt feels her bp has been high due to issues at home. pt is not on medication at this but has been in the past. at last specialist appt it was high. )        HPI:     Chief Complaint   Patient presents with   ??? Hypertension     pt does not check bp at home. pt feels her bp has been high due to issues at home. pt is not on medication at this but has been in the past. at last specialist appt it was high.        HPI    Ms. Tanya Velasquez presents to the office today to discuss her blood pressure.  Her blood pressure has been elevated at her pain management office.  She does not check her blood pressure at home.  She denies any chest pain, shortness of breath, leg swelling.    Patient is here to discuss anxiety/depression.  Doing well on medication.  Denies any side effects.  Would like to continue with medication, but feels like she needs an increased dose.  She has had a lot of stress lately with her marriage and her sister having breast cancer.  Denies any thought of hurting oneself or others around them.     She has chronic hip and back pain.  She is taking Diclofenac two times daily.  She is seeing pain management.      She is still smoking. She smokes 1/2 pack a day.      She is here to discuss her blood test she had done at her last office visit.     Past Medical History:   Diagnosis Date   ??? Chronic back pain    ??? Chronic hip pain 2009   ??? Depression    ??? Osteoarthritis    ??? Substance abuse ([place])     Tobacco abuse       Past Surgical History:   Procedure Laterality Date   ??? BACK SURGERY  05/2017    pt has an intrathecal pain pump implanted in her back.    ??? ELBOW SURGERY Left     Childhood     ??? HYSTERECTOMY, VAGINAL     ??? TOTAL HIP ARTHROPLASTY Right 2009       Family History   Problem Relation Age of Onset   ??? Breast Cancer Mother    ??? Heart Attack Father    ??? High Blood Pressure Father    ??? High Cholesterol Father    ??? Breast Cancer Sister        Social History     Tobacco Use   ??? Smoking status: Current Every Day Smoker     Packs/day: 0.50     Years: 23.00     Pack years: 11.50     Types: Cigarettes     Start date: 05/14/1994   ??? Smokeless tobacco: Never Used   Substance Use Topics   ??? Alcohol use: Yes     Alcohol/week: 12.0 standard drinks     Types: 12 Cans of beer per week     Comment:  occ         Current Outpatient Medications   Medication Sig Dispense Refill   ??? escitalopram (LEXAPRO) 20 MG tablet Take 1 tablet by mouth daily 30 tablet 5   ??? sodium chloride 0.9 % SOLN with HYDROmorphone HCl PF 50 MG/5ML SOLN 0.2 mg/mL Infuse 0.5 mg/hr intravenously continuous. Indications: pt has pain pump     ??? diclofenac (VOLTAREN) 75 MG EC tablet Take 1 tablet by mouth 2 times daily 60 tablet 5   ??? valACYclovir (VALTREX) 1 g tablet Take 2 tablets in the AM and PM once at the onset of a cold sore 30 tablet 1   ??? cyclobenzaprine (FLEXERIL) 10 MG tablet   2     No current facility-administered medications for this visit.      Allergies   Allergen Reactions   ??? Gabapentin      Other reaction(s): Headaches       Subjective:      Review of Systems      Objective:     Vitals:    01/30/18 0711 01/30/18 0713   BP: (!) 122/90 130/80   Site: Left Upper Arm Right Upper Arm   Position: Sitting Sitting   Cuff Size: Medium Adult Medium Adult   Pulse: 104    SpO2: 99%    Weight: 176 lb (79.8 kg)    Height: 5' 6" (1.676 m)      Wt Readings from Last 3 Encounters:   01/30/18 176 lb (79.8 kg)   12/05/17 178 lb (80.7 kg)   07/22/15 155 lb (70.3 kg)     Temp Readings from Last 3 Encounters:   07/22/15 97.9 ??F (36.6 ??C) (Oral)     BP Readings from Last 3 Encounters:   01/30/18 130/80   12/05/17 128/89   07/22/15 (!) 142/92      Pulse Readings from Last 3 Encounters:   01/30/18 104   12/05/17 102   07/22/15 116     Physical Exam   Constitutional: She is oriented to person, place, and time. She appears well-developed and well-nourished. No distress.   HENT:   Head: Normocephalic and atraumatic.   Right Ear: External ear normal.   Left Ear: External ear normal.   Nose: Nose normal.   Mouth/Throat: Oropharynx is clear and moist. No oropharyngeal exudate.   Eyes: Conjunctivae and EOM are normal. Right eye exhibits no discharge. Left eye exhibits no discharge. No scleral icterus.   Neck: Normal range of motion. Neck supple. No tracheal deviation present.   Cardiovascular: Normal rate, regular rhythm, normal heart sounds and intact distal pulses. Exam reveals no gallop and no friction rub.   No murmur heard.  Pulmonary/Chest: Effort normal and breath sounds normal. No stridor. No respiratory distress. She has no wheezes. She has no rales. She exhibits no tenderness.   Abdominal: Soft. Bowel sounds are normal. She exhibits no distension and no mass. There is no tenderness. There is no rebound and no guarding. No hernia.   Musculoskeletal: Normal range of motion. She exhibits no edema or deformity.        Cervical back: She exhibits tenderness and pain.        Lumbar back: She exhibits tenderness and pain.   Lymphadenopathy:     She has no cervical adenopathy.   Neurological: She is alert and oriented to person, place, and time. She has normal reflexes. She displays normal reflexes. No cranial nerve deficit. She exhibits normal muscle tone. Coordination normal.  Skin: Skin is warm and dry. Capillary refill takes less than 2 seconds. No rash noted. She is not diaphoretic. No erythema. No pallor.   Psychiatric: She has a normal mood and affect. Her behavior is normal. Judgment and thought content normal.   Nursing note and vitals reviewed.      Nurse Only on 12/10/2017   Component Date Value Ref Range Status   ??? Hep C Ab Interp 12/10/2017  Non-reactive  Non-reactive Final   ??? HIV Ag/Ab 12/10/2017 Non-Reactive  Non-reactive Final   ??? HIV-1 Antibody 12/10/2017 Non-Reactive  Non-reactive Final   ??? HIV ANTIGEN 12/10/2017 Non-Reactive  Non-reactive Final   ??? HIV-2 Ab 12/10/2017 Non-Reactive  Non-reactive Final   ??? TSH 12/10/2017 3.68  0.27 - 4.20 uIU/mL Final   ??? Vitamin B-12 12/10/2017 560  211 - 911 pg/mL Final   ??? Folate 12/10/2017 9.55  4.78 - 24.20 ng/mL Final    Comment: Effective 03-29-15 10:00am EST  Please note reference ranges have  changed for Folate.     ??? Vit D, 25-Hydroxy 12/10/2017 31.8  >=30 ng/mL Final    Comment: <=20 ng/mL............Marland KitchenDeficient  21-29 ng/mL..........Marland KitchenInsufficient  >=30 ng/mL.........Marland KitchenSufficient     ??? Cholesterol, Total 12/10/2017 255* 0 - 199 mg/dL Final   ??? Triglycerides 12/10/2017 112  0 - 150 mg/dL Final   ??? HDL 12/10/2017 102* 40 - 60 mg/dL Final   ??? LDL Calculated 12/10/2017 131* <100 mg/dL Final   ??? VLDL Cholesterol Calculated 12/10/2017 22  Not Established mg/dL Final   ??? Sodium 12/10/2017 143  136 - 145 mmol/L Final   ??? Potassium 12/10/2017 4.7  3.5 - 5.1 mmol/L Final   ??? Chloride 12/10/2017 104  99 - 110 mmol/L Final   ??? CO2 12/10/2017 24  21 - 32 mmol/L Final   ??? Anion Gap 12/10/2017 15  3 - 16 Final   ??? Glucose 12/10/2017 87  70 - 99 mg/dL Final   ??? BUN 12/10/2017 17  7 - 20 mg/dL Final   ??? CREATININE 12/10/2017 1.0  0.6 - 1.1 mg/dL Final   ??? GFR Non-African American 12/10/2017 58* >60 Final    Comment: >60 mL/min/1.61m EGFR, calc. for ages 178and older using the  MDRD formula (not corrected for weight), is valid for stable  renal function.     ??? GFR African American 12/10/2017 >60  >60 Final    Comment: Chronic Kidney Disease: less than 60 ml/min/1.73 sq.m.          Kidney Failure: less than 15 ml/min/1.73 sq.m.  Results valid for patients 18 years and older.     ??? Calcium 12/10/2017 9.7  8.3 - 10.6 mg/dL Final   ??? Total Protein 12/10/2017 7.2  6.4 - 8.2 g/dL Final   ??? Alb 12/10/2017 4.6  3.4 - 5.0 g/dL  Final   ??? Albumin/Globulin Ratio 12/10/2017 1.8  1.1 - 2.2 Final   ??? Total Bilirubin 12/10/2017 <0.2  0.0 - 1.0 mg/dL Final   ??? Alkaline Phosphatase 12/10/2017 76  40 - 129 U/L Final   ??? ALT 12/10/2017 35  10 - 40 U/L Final   ??? AST 12/10/2017 23  15 - 37 U/L Final   ??? Globulin 12/10/2017 2.6  g/dL Final   ??? WBC 12/10/2017 8.7  4.0 - 11.0 K/uL Final   ??? RBC 12/10/2017 4.13  4.00 - 5.20 M/uL Final   ??? Hemoglobin 12/10/2017 13.6  12.0 - 16.0 g/dL Final   ??? Hematocrit 12/10/2017 40.1  36.0 - 48.0 % Final   ??? MCV 12/10/2017 96.9  80.0 - 100.0 fL Final   ??? MCH 12/10/2017 33.0  26.0 - 34.0 pg Final   ??? MCHC 12/10/2017 34.1  31.0 - 36.0 g/dL Final   ??? RDW 12/10/2017 13.6  12.4 - 15.4 % Final   ??? Platelets 12/10/2017 468* 135 - 450 K/uL Final   ??? MPV 12/10/2017 7.6  5.0 - 10.5 fL Final   ??? Neutrophils % 12/10/2017 51.5  % Final   ??? Lymphocytes % 12/10/2017 34.5  % Final   ??? Monocytes % 12/10/2017 8.6  % Final   ??? Eosinophils % 12/10/2017 4.0  % Final   ??? Basophils % 12/10/2017 1.4  % Final   ??? Neutrophils Absolute 12/10/2017 4.5  1.7 - 7.7 K/uL Final   ??? Lymphocytes Absolute 12/10/2017 3.0  1.0 - 5.1 K/uL Final   ??? Monocytes Absolute 12/10/2017 0.8  0.0 - 1.3 K/uL Final   ??? Eosinophils Absolute 12/10/2017 0.3  0.0 - 0.6 K/uL Final   ??? Basophils Absolute 12/10/2017 0.1  0.0 - 0.2 K/uL Final           Assessment & Plan:     The following diagnoses and conditions are stable with no further orders unless indicated:  1. Reactive depression    2. Chronic pain syndrome    3. Decreased GFR    4. Abnormal CBC    5. Tobacco abuse        [place] was seen today for hypertension.    Lexapro increased from 10 to 20.  She is aware of potential side effects and would like to continue to increase the medication.  She is to call and let office know if she is interested in seeing counseling.  She is going to think about it.     Chronic pain is being managed at Dr. Ree Shay pain management.      Discussed her test results - platelets were  elevated and GFR down to 58.  Informed her that NSAIDS can cause GFR to decrease.  Will recheck blood today.  If GFR still lower than 60, she is to go off of NSAIDS.      Recommend tobacco cessation.     Diagnoses and all orders for this visit:    Reactive depression  -     escitalopram (LEXAPRO) 20 MG tablet; Take 1 tablet by mouth daily    Chronic pain syndrome    Decreased GFR  -     Basic Metabolic Panel    Abnormal CBC  -     CBC Auto Differential    Tobacco abuse      Prior to Visit Medications    Medication Sig Taking? Authorizing Provider   escitalopram (LEXAPRO) 20 MG tablet Take 1 tablet by mouth daily Yes Tarri Fuller, APRN - CNP   sodium chloride 0.9 % SOLN with HYDROmorphone HCl PF 50 MG/5ML SOLN 0.2 mg/mL Infuse 0.5 mg/hr intravenously continuous. Indications: pt has pain pump Yes Historical Provider, MD   diclofenac (VOLTAREN) 75 MG EC tablet Take 1 tablet by mouth 2 times daily Yes Tarri Fuller, APRN - CNP   valACYclovir (VALTREX) 1 g tablet Take 2 tablets in the AM and PM once at the onset of a cold sore Yes Tarri Fuller, APRN - CNP   cyclobenzaprine (FLEXERIL) 10 MG tablet  Yes Historical Provider, MD        Orders Placed This Encounter  Medications   ??? escitalopram (LEXAPRO) 20 MG tablet     Sig: Take 1 tablet by mouth daily     Dispense:  30 tablet     Refill:  5         Return in about 6 months (around 07/31/2018), or if symptoms worsen or fail to improve, for [facility] .    Patient should call the office immediately with new or ongoing signs or symptoms or worsening, or proceedto the emergency room.  No changes in past medical history, past surgical history, social history, or family history were noted during the patient encounter unless specifically listed above.  All updates of past medicalhistory, past surgical history, social history, or family history were reviewed personally by me during the office visit.  All problems listed in the assessment are stable unless  noted otherwise.  Medication profilereviewed personally by me during the office visit.  Medication side effects and possible impairments from medications were discussed as applicable.  ??  Call if pattern of symptoms change or persists for an extended time.    This document was prepared by a combination of typing and transcription through a voice recognition software.    Tobacco abuse: Patient was counseled about stopping tobacco use. Discussed harmful effects of continued tobacco use including COPD, cardiovascular disease, and/or death. Discussed the increased risk of pneumonia as well as the potential for substantial decline in lung function. The following recommendations were given to improve chances of quitting:  ??  1)                   Chances of stopping improve with each attempt  ??  2)                   Encourage all other occupants in the house to quit  ??  3)                   Remove all tobacco products from home, work, and vehicles  ??  4)                   Tobacco cessation aids such as nicotine replacement therapy options    It is very important that she quit smoking. There are various alternatives available to help with this difficult task, but first and foremost, she must make a firm commitment and decision to quit. The nature of nicotine addiction is discussed. The usefulness of behavioral therapy is discussed and suggested.  The correct use, cost and side effects of nicotine replacement therapy such as gum or patches is discussed. Bupropion and its cost (sometimes not covered fully by insurance) and side effects are reviewed. The quit rates are discussed. I recommend she not allow potential costs of treatment to deter her from using nicotine replacement therapy or bupropion, as the long term economic and health benefits are obvious.

## 2018-04-15 ENCOUNTER — Emergency Department: Admit: 2018-04-15 | Payer: Auto Insurance (includes no fault)

## 2018-04-15 ENCOUNTER — Inpatient Hospital Stay
Admit: 2018-04-15 | Discharge: 2018-04-15 | Disposition: A | Discharge status: Home / Self Care | Payer: Auto Insurance (includes no fault) | Attending: Emergency Medicine

## 2018-04-15 DIAGNOSIS — M545 Low back pain: Secondary | ICD-10-CM

## 2018-04-15 LAB — CBC WITH AUTO DIFFERENTIAL
Basophils %: 1.4 %
Basophils Absolute: 0.2 10*3/uL (ref 0.0–0.2)
Eosinophils %: 1.8 %
Eosinophils Absolute: 0.2 10*3/uL (ref 0.0–0.6)
Hematocrit: 43.1 % (ref 36.0–48.0)
Hemoglobin: 14.9 g/dL (ref 12.0–16.0)
Lymphocytes %: 22.9 %
Lymphocytes Absolute: 2.4 10*3/uL (ref 1.0–5.1)
MCH: 32.9 pg (ref 26.0–34.0)
MCHC: 34.6 g/dL (ref 31.0–36.0)
MCV: 95.1 fL (ref 80.0–100.0)
MPV: 6.8 fL (ref 5.0–10.5)
Monocytes %: 5.9 %
Monocytes Absolute: 0.6 10*3/uL (ref 0.0–1.3)
Neutrophils %: 68 %
Neutrophils Absolute: 7.3 10*3/uL (ref 1.7–7.7)
Platelets: 456 10*3/uL — ABNORMAL HIGH (ref 135–450)
RBC: 4.53 M/uL (ref 4.00–5.20)
RDW: 13.9 % (ref 12.4–15.4)
WBC: 10.7 10*3/uL (ref 4.0–11.0)

## 2018-04-15 LAB — COMPREHENSIVE METABOLIC PANEL W/ REFLEX TO MG FOR LOW K
ALT: 55 U/L — ABNORMAL HIGH (ref 10–40)
AST: 34 U/L (ref 15–37)
Albumin/Globulin Ratio: 1.3 (ref 1.1–2.2)
Albumin: 4.8 g/dL (ref 3.4–5.0)
Alkaline Phosphatase: 65 U/L (ref 40–129)
Anion Gap: 13 (ref 3–16)
BUN: 11 mg/dL (ref 7–20)
CO2: 23 mmol/L (ref 21–32)
Calcium: 10.1 mg/dL (ref 8.3–10.6)
Chloride: 103 mmol/L (ref 99–110)
Creatinine: 0.8 mg/dL (ref 0.6–1.1)
GFR African American: >60 (ref >60)
GFR Non-African American: >60 (ref >60)
Globulin: 3.6 g/dL
Glucose: 97 mg/dL (ref 70–99)
Potassium reflex Magnesium: 4.4 mmol/L (ref 3.5–5.1)
Sodium: 139 mmol/L (ref 136–145)
Total Bilirubin: <0.2 mg/dL (ref 0.0–1.0)
Total Protein: 8.4 g/dL — ABNORMAL HIGH (ref 6.4–8.2)

## 2018-04-15 MED ORDER — ACETAMINOPHEN 500 MG PO TABS
500 MG | Freq: Once | ORAL | Status: AC
Start: 2018-04-15 — End: 2018-04-15
  Administered 2018-04-15: 19:00:00 1000 mg via ORAL

## 2018-04-15 MED ORDER — SODIUM CHLORIDE 0.9 % IV BOLUS
0.9 % | Freq: Once | INTRAVENOUS | Status: AC
Start: 2018-04-15 — End: 2018-04-15
  Administered 2018-04-15: 19:00:00 1000 mL via INTRAVENOUS

## 2018-04-15 MED FILL — MAPAP 500 MG PO TABS: 500 mg | ORAL | Qty: 2
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
## 2018-04-15 NOTE — Discharge Instructions (Signed)
Take ibuprofen, up to 600 mg four times per day every day with food for 3 days, then as needed for continued pain and swelling. You may alternate this with up to 1000 mg acetaminophen (Tylenol), four times per day. Please understand, you will most likely hurt worse tomorrow than you do today.  This is normal and expected.  You should begin to gradually improve each day after that.  Please call your doctor, or return to the emergency department, if you have increasing pain, or if you develop pain in other areas of her body that were not previously painful, or if you develop persistent headaches, visual changes, nausea or vomiting, weakness, numbness, or other concerning symptoms.

## 2018-04-23 NOTE — Telephone Encounter (Signed)
Pt scheduled

## 2018-04-23 NOTE — Telephone Encounter (Signed)
Is this ok for a same day?
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
## 2018-04-23 NOTE — Telephone Encounter (Signed)
240

## 2018-04-24 ENCOUNTER — Ambulatory Visit: Payer: Auto Insurance (includes no fault)

## 2018-04-24 ENCOUNTER — Inpatient Hospital Stay: Admit: 2018-04-24 | Payer: Auto Insurance (includes no fault)

## 2018-04-24 ENCOUNTER — Ambulatory Visit: Admit: 2018-04-24 | Discharge: 2018-04-24 | Payer: Auto Insurance (includes no fault) | Attending: Registered Nurse

## 2018-04-24 DIAGNOSIS — Z09 Encounter for follow-up examination after completed treatment for conditions other than malignant neoplasm: Secondary | ICD-10-CM

## 2018-04-24 DIAGNOSIS — M542 Cervicalgia: Secondary | ICD-10-CM

## 2018-04-24 MED ORDER — PREDNISONE 10 MG PO TABS
10 MG | ORAL_TABLET | ORAL | 0 refills | Status: DC
Start: 2018-04-24 — End: 2018-05-08

## 2018-04-24 MED ORDER — IOPAMIDOL 76 % IV SOLN
76 % | Freq: Once | INTRAVENOUS | Status: AC | PRN
Start: 2018-04-24 — End: 2018-04-24
  Administered 2018-04-24: 23:00:00 75 mL via INTRAVENOUS
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
## 2018-04-24 NOTE — Patient Instructions (Addendum)
Patient Education        Whiplash: Care Instructions  Your Care Instructions  Whiplash occurs when your head is suddenly forced forward and then snapped backward, as might happen in a car accident or sports injury. This can cause pain and stiffness in your neck. Your head, chest, shoulders, and arms also may hurt.  Most whiplash gets better with home care. Your doctor may advise you to take medicine to relieve pain or relax your muscles. He or she may suggest exercise and physical therapy to increase flexibility and relieve pain. You can try wearing a neck (cervical) collar to support your neck. For a while you probably will need to avoid lifting and other activities that can strain the neck.  Follow-up care is a key part of your treatment and safety. Be sure to make and go to all appointments, and call your doctor if you are having problems. It's also a good idea to know your test results and keep a list of the medicines you take.  How can you care for yourself at home?  ?? Take pain medicines exactly as directed.  ? If the doctor gave you a prescription medicine for pain, take it as prescribed.  ? If you are not taking a prescription pain medicine, ask your doctor if you can take an over-the-counter medicine.  ? Do not take two or more pain medicines at the same time unless the doctor told you to. Many pain medicines have acetaminophen, which is Tylenol. Too much acetaminophen (Tylenol) can be harmful.  ?? You can try using a soft foam collar to support your neck for short periods of time. You can buy one at most drugstores. Do not wear the collar more than 2 or 3 days unless your doctor tells you to.  ?? You can try using heat and ice to see if it helps.  ? Try using a heating pad on a low or medium setting for 15 to 20 minutes every 2 to 3 hours. Try a warm shower in place of one session with the heating pad. You can also buy single-use heat wraps that last up to 8 hours.  ? You can also try an ice pack for 10 to  15 minutes every 2 to 3 hours.  ?? Do not do anything that makes the pain worse. Take it easy for a couple of days. You can do your usual activities if they do not hurt your neck or put it at risk for more stress or injury. Avoid lifting, sports, or other activities that might strain your neck.  ?? Try sleeping on a special neck pillow. Place it under your neck, not under your head. Placing a tightly rolled-up towel under your neck while you sleep will also work. If you use a neck pillow or rolled towel, do not use your regular pillow at the same time.  ?? Once your neck pain is gone, do exercises to stretch your neck and back and make them stronger. Your doctor or physical therapist can tell you which exercises are best.  When should you call for help?  Call 911 anytime you think you may need emergency care. For example, call if:  ?? ?? You are unable to move an arm or a leg at all.   ??Call your doctor now or seek immediate medical care if:  ?? ?? You have new or worse symptoms in your arms, legs, chest, belly, or buttocks. Symptoms may include:  ? Numbness or tingling.  ?   Weakness.  ? Pain.   ?? ?? You lose bladder or bowel control.   ??Watch closely for changes in your health, and be sure to contact your doctor if:  ?? ?? You are not getting better as expected.   Where can you learn more?  Go to [email or web address] and sign in to your MyChart account. Enter [phone] in the Search Health Information box to learn more about "Whiplash: Care Instructions."     If you do not have an account, please click on the "Sign Up Now" link.  Current as of: January 31, 2017  Content Version: 12.1  ?? 2006-2019 [employer]. Care instructions adapted under license by [facility]. If you have questions about a medical condition or this instruction, always ask your healthcare professional. [employer], [employer] disclaims any warranty or liability for your use of this information.       Patient Education         Concussion: Care Instructions  Your Care Instructions    A concussion is a kind of injury to the brain. It happens when the head receives a hard blow. The impact can jar or shake the brain against the skull. This interrupts the brain's normal activities. Although you may have cuts or bruises on your head or face, you may have no other visible signs of a brain injury. In most cases, damage to the brain from a concussion can't be seen in tests such as a CT or MRI scan.  For a few weeks, you may have low energy, dizziness, trouble sleeping, a headache, ringing in your ears, or nausea. You may also feel anxious, grumpy, or depressed. You may have problems with memory and concentration. These symptoms are common after a concussion. They should slowly improve over time. Sometimes this takes weeks or even months. Someone who lives with you should know how to care for you. Please share this and all information with a caregiver who will be available to help if needed.  Follow-up care is a key part of your treatment and safety. Be sure to make and go to all appointments, and call your doctor if you are having problems. It's also a good idea to know your test results and keep a list of the medicines you take.  How can you care for yourself at home?  Pain control  ?? Put ice or a cold pack on the part of your head that hurts for 10 to 20 minutes at a time. Put a thin cloth between the ice and your skin.  ?? Be safe with medicines. Read and follow all instructions on the label.  ? If the doctor gave you a prescription medicine for pain, take it as prescribed.  ? If you are not taking a prescription pain medicine, ask your doctor if you can take an over-the-counter medicine.  Recovery  ?? Follow your doctor's instructions. He or she will tell you if you need someone to watch you closely for the next 24 hours or longer.  ?? Rest is the best way to recover from a concussion. You need to rest your body and your brain:  ? Get plenty of  sleep at night. And take rest breaks during the day.  ? Avoid activities that take a lot of physical or mental work. This includes housework, exercise, schoolwork, video games, text messaging, and using the computer.  ? You may need to change your school or work schedule while you recover.  ? Return to your normal activities slowly.  Do not try to do too much at once.  ?? Do not drink alcohol or use illegal drugs. Alcohol and illegal drugs can slow your recovery. And they can increase your risk of a second brain injury.  ?? Avoid activities that could lead to another concussion. Follow your doctor's instructions for a gradual return to activity and sports.  ?? Ask your doctor when it's okay for you to drive a car, ride a bike, or operate machinery.  How should you return to activity?  Your return to activity can begin after 1 to 2 days of physical and mental rest. After resting, you can gradually increase your activity as long as it does not cause new symptoms or worsen your symptoms.  Doctors and concussion specialists suggest steps to follow for returning to sports after a concussion. Use these steps as a guide. You should slowly progress through the following levels of activity:  1. Limited activity. You can take part in daily activities as long as the activity doesn't increase your symptoms or cause new symptoms.  2. Light aerobic activity. This can include walking, swimming, or other exercise at less than 70% of maximum heart rate. No resistance training is included in this step.  3. Sport-specific exercise. This includes running drills or skating drills (depending on the sport), but no head impact.  4. Noncontact training drills. This includes more complex training drills such as passing. The athlete may also begin light resistance training.  5. Primary school teacher. The athlete can participate in normal training.  6. Return to normal game play. This is the final step and allows the athlete to join in normal game  play.  Watch and keep track of your progress. It should take at least 6 days for you to go from light activity to normal game play.  Make sure that you can stay at each new level of activity for at least 24 hours without symptoms, or as long as your doctor says, before doing more. If one or more symptoms come back, return to a lower level of activity for at least 24 hours. Don't move on until all symptoms are gone.  When should you call for help?  Call 911 anytime you think you may need emergency care. For example, call if:  ?? ?? You have a seizure.   ?? ?? You passed out (lost consciousness).   ?? ?? You are confused or can't stay awake.   ??Call your doctor now or seek immediate medical care if:  ?? ?? You have new or worse vomiting.   ?? ?? You feel less alert.   ?? ?? You have new weakness or numbness in any part of your body.   ??Watch closely for changes in your health, and be sure to contact your doctor if:  ?? ?? You do not get better as expected.   ?? ?? You have new symptoms, such as headaches, trouble concentrating, or changes in mood.   Where can you learn more?  Go to [email or web address] and sign in to your MyChart account. Enter [phone] in the Search Health Information box to learn more about "Concussion: Care Instructions."     If you do not have an account, please click on the "Sign Up Now" link.  Current as of: August 08, 2017  Content Version: 12.1  ?? 2006-2019 [employer]. Care instructions adapted under license by [facility]. If you have questions about a medical condition or this instruction, always ask your healthcare professional. [employer]  disclaims any warranty or liability for your use of this information.

## 2018-04-25 ENCOUNTER — Inpatient Hospital Stay: Admit: 2018-04-25 | Payer: Auto Insurance (includes no fault)

## 2018-04-25 ENCOUNTER — Encounter

## 2018-04-25 DIAGNOSIS — R9389 Abnormal findings on diagnostic imaging of other specified body structures: Secondary | ICD-10-CM

## 2018-04-25 MED ORDER — IOPAMIDOL 76 % IV SOLN
76 % | Freq: Once | INTRAVENOUS | Status: AC | PRN
Start: 2018-04-25 — End: 2018-04-25
  Administered 2018-04-25: 18:00:00 35 mL via INTRAVENOUS

## 2018-04-26 LAB — CULTURE, THROAT: Throat Culture: NORMAL

## 2018-05-08 ENCOUNTER — Ambulatory Visit: Admit: 2018-05-08 | Discharge: 2018-05-08 | Payer: Auto Insurance (includes no fault) | Attending: Registered Nurse

## 2018-05-08 DIAGNOSIS — S161XXD Strain of muscle, fascia and tendon at neck level, subsequent encounter: Secondary | ICD-10-CM

## 2018-05-08 NOTE — Progress Notes (Signed)
[facility]  [facility]  [address]  [place] [facility] [id number]  Dept: [phone]  Dept Fax: [phone]  Loc: [phone]    Tanya StableColleen S Velasquez is a 52 y.o. female who presents today for her medical conditions/complaints as noted below.  Tanya StableColleen S Velasquez is c/o of Other (pt is here to follow up on head and neck pain. She feels like she is doing better )        HPI:     Chief Complaint   Patient presents with   ??? Other     pt is here to follow up on head and neck pain. She feels like she is doing better        HPI    Tanya SideColleen presents to the office to follow up on her concussion and neck pain.  She feels like her concussion and neck pain have improved.  She feels like her headaches having improved dramatically.  She has noticed only getting 2-3 random headaches a day that only last several minutes.  She is still having some blurry vision, but states he is going to see an eye doctor.  She denies any unilateral weakness, slurred speech, confusion, abnormal gait.   She admits to her neck pain still being present, but has improved.  She feels like certain movements cause her neck to "pop."  The muscles in her back have relaxed some.  She is alternating between heat and ice.  She admits to having some left sided rib cage pain from time-to-time.  She feels like it has improved some as well with the prednisone.      She admits to noticing her heart rate increasing. She feels tired all the time, but feels like her fatigue is at baseline.  She denies any shortness of breath or coughing.  She denies palpitations.  She does smoke about 1/2 pack of cigarettes a day.     Past Medical History:   Diagnosis Date   ??? Chronic back pain    ??? Chronic hip pain 2009   ??? Depression    ??? Osteoarthritis    ??? Substance abuse (HCC)     Tobacco abuse       Past Surgical History:   Procedure Laterality Date   ??? BACK SURGERY  05/2017    pt has an intrathecal pain pump implanted in her back.    ??? ELBOW SURGERY Left      Childhood    ??? HYSTERECTOMY, VAGINAL     ??? TOTAL HIP ARTHROPLASTY Right 2009       Family History   Problem Relation Age of Onset   ??? Breast Cancer Mother    ??? Heart Attack Father    ??? High Blood Pressure Father    ??? High Cholesterol Father    ??? Breast Cancer Sister        Social History     Tobacco Use   ??? Smoking status: Current Every Day Smoker     Packs/day: 0.50     Years: 23.00     Pack years: 11.50     Types: Cigarettes     Start date: 05/14/1994   ??? Smokeless tobacco: Never Used   Substance Use Topics   ??? Alcohol use: Yes     Alcohol/week: 12.0 standard drinks     Types: 12 Cans of beer per week     Comment: occ         Current Outpatient  Medications   Medication Sig Dispense Refill   ??? escitalopram (LEXAPRO) 20 MG tablet Take 1 tablet by mouth daily 30 tablet 5   ??? sodium chloride 0.9 % SOLN with HYDROmorphone HCl PF 50 MG/5ML SOLN 0.2 mg/mL Infuse 0.5 mg/hr intravenously continuous. Indications: pt has pain pump     ??? diclofenac (VOLTAREN) 75 MG EC tablet Take 1 tablet by mouth 2 times daily 60 tablet 5   ??? valACYclovir (VALTREX) 1 g tablet Take 2 tablets in the AM and PM once at the onset of a cold sore 30 tablet 1   ??? cyclobenzaprine (FLEXERIL) 10 MG tablet   2     No current facility-administered medications for this visit.      Allergies   Allergen Reactions   ??? Gabapentin      Other reaction(s): Headaches       Subjective:      Review of Systems   Constitutional: Negative.    Respiratory: Negative.    Cardiovascular: Negative.    Musculoskeletal: Positive for arthralgias, back pain, myalgias, neck pain and neck stiffness. Negative for gait problem and joint swelling.   Skin: Negative.    Neurological: Positive for dizziness (Improving ) and headaches (Improving ). Negative for tremors, seizures, syncope, facial asymmetry, speech difficulty, weakness, light-headedness and numbness.   Psychiatric/Behavioral: Negative.          Objective:     Vitals:    05/08/18 1429 05/08/18 1521   BP: 132/80    Site:  Right Upper Arm    Position: Sitting    Cuff Size: Medium Adult    Pulse: 101 90   SpO2: 98%    Weight: 183 lb (83 kg)    Height: 5\' 6"  (1.676 m)      Wt Readings from Last 3 Encounters:   05/08/18 183 lb (83 kg)   04/24/18 182 lb (82.6 kg)   04/15/18 176 lb (79.8 kg)     Temp Readings from Last 3 Encounters:   04/15/18 99.4 ??F (37.4 ??C) (Oral)   07/22/15 97.9 ??F (36.6 ??C) (Oral)     BP Readings from Last 3 Encounters:   05/08/18 132/80   04/24/18 (!) 144/88   04/15/18 (!) 164/91     Pulse Readings from Last 3 Encounters:   05/08/18 90   04/24/18 94   04/15/18 89     Physical Exam  Vitals signs and nursing note reviewed.   Constitutional:       General: She is not in acute distress.     Appearance: Normal appearance. She is well-developed. She is not diaphoretic.   HENT:      Head: Normocephalic and atraumatic.      Right Ear: Tympanic membrane, ear canal and external ear normal. There is no impacted cerumen.      Left Ear: Tympanic membrane, ear canal and external ear normal. There is no impacted cerumen.      Nose: Nose normal. No congestion or rhinorrhea.      Mouth/Throat:      Mouth: Mucous membranes are moist.      Pharynx: Oropharynx is clear. No oropharyngeal exudate or posterior oropharyngeal erythema.   Eyes:      General: No scleral icterus.        Right eye: No discharge.         Left eye: No discharge.      Conjunctiva/sclera: Conjunctivae normal.   Neck:      Musculoskeletal: Normal range of motion  and neck supple. No neck rigidity or muscular tenderness.      Vascular: No carotid bruit.      Trachea: No tracheal deviation.   Cardiovascular:      Rate and Rhythm: Normal rate and regular rhythm.      Pulses: Normal pulses.      Heart sounds: Normal heart sounds. No murmur. No friction rub. No gallop.    Pulmonary:      Effort: Pulmonary effort is normal. No respiratory distress.      Breath sounds: Normal breath sounds. No stridor. No wheezing, rhonchi or rales.   Chest:      Chest wall: No tenderness.    Abdominal:      General: Bowel sounds are normal. There is no distension.      Palpations: Abdomen is soft. There is no mass.      Tenderness: There is no tenderness. There is no guarding or rebound.      Hernia: No hernia is present.   Musculoskeletal:         General: No swelling, deformity or signs of injury.      Cervical back: She exhibits decreased range of motion, tenderness, pain and spasm.      Lumbar back: She exhibits pain and spasm.      Right lower leg: No edema.      Left lower leg: No edema.   Lymphadenopathy:      Cervical: No cervical adenopathy.   Skin:     General: Skin is warm and dry.      Capillary Refill: Capillary refill takes less than 2 seconds.      Coloration: Skin is not jaundiced or pale.      Findings: No bruising, erythema, lesion or rash.   Neurological:      General: No focal deficit present.      Mental Status: She is alert and oriented to person, place, and time. Mental status is at baseline.      GCS: GCS eye subscore is 4. GCS verbal subscore is 5. GCS motor subscore is 6.      Cranial Nerves: Cranial nerves are intact. No cranial nerve deficit.      Sensory: Sensation is intact. No sensory deficit.      Motor: Motor function is intact. No weakness, tremor, atrophy, abnormal muscle tone, seizure activity or pronator drift.      Coordination: Coordination is intact. Romberg sign negative. Coordination normal. Finger-Nose-Finger Test and Heel to [facility] Test normal. Rapid alternating movements normal.      Gait: Gait normal.      Deep Tendon Reflexes: Reflexes are normal and symmetric. Reflexes normal.   Psychiatric:         Mood and Affect: Mood normal.         Behavior: Behavior normal.         Thought Content: Thought content normal.         Judgment: Judgment normal.         Office Visit on 04/24/2018   Component Date Value Ref Range Status   ??? Throat Culture 04/24/2018 Normal oral flora, No Beta Strep isolated   Final           Assessment & Plan:     The following diagnoses  and conditions are Velasquez with no further orders unless indicated:  1. Strain of neck muscle, subsequent encounter    2. Concussion without loss of consciousness, subsequent encounter    3. Tachycardia  4. Tobacco abuse        Metha was seen today for other.    Neck pain has improved. She may continue with her NSAID and muscle relaxer.      As far as her concussion, headaches and dizziness have improved.  She is still having blurry vision  She is to follow up with ophthalmology.  Neuro exam unremarkable.  CT scan and CTA scans reviewed.  Recommend her continue with rest and decreasing her use of electronics.     With her admitting to noticing her heart rate increasing, but not feeling palpitations, this may be related to anxiety versus caffeine and nicotine.  She admits to her sister having atrial fibrillation in her 30's.  She admits there EKGs always being normal, but is worried about her heart rate being elevated.  Echocardiogram ordered.  Will continue to monitor her BP - BP Velasquez today.  She may benefit from a beta blocker in the future.      Diagnoses and all orders for this visit:    Strain of neck muscle, subsequent encounter    Concussion without loss of consciousness, subsequent encounter    Tachycardia  -     ECHO Complete 2D W Doppler W Color; Future    Tobacco abuse      Prior to Visit Medications    Medication Sig Taking? Authorizing Provider   escitalopram (LEXAPRO) 20 MG tablet Take 1 tablet by mouth daily Yes [name], APRN - CNP   sodium chloride 0.9 % SOLN with HYDROmorphone HCl PF 50 MG/5ML SOLN 0.2 mg/mL Infuse 0.5 mg/hr intravenously continuous. Indications: pt has pain pump Yes Historical Provider, MD   diclofenac (VOLTAREN) 75 MG EC tablet Take 1 tablet by mouth 2 times daily Yes [name], APRN - CNP   valACYclovir (VALTREX) 1 g tablet Take 2 tablets in the AM and PM once at the onset of a cold sore Yes [name], APRN - CNP   cyclobenzaprine (FLEXERIL)  10 MG tablet  Yes Historical Provider, MD      No orders of the defined types were placed in this encounter.        Return in about 4 weeks (around 06/05/2018) for Echo follow up.    Patient should call the office immediately with new or ongoing signs or symptoms or worsening, or proceedto the emergency room.  No changes in past medical history, past surgical history, social history, or family history were noted during the patient encounter unless specifically listed above.  All updates of past medicalhistory, past surgical history, social history, or family history were reviewed personally by me during the office visit.  All problems listed in the assessment are Velasquez unless noted otherwise.  Medication profilereviewed personally by me during the office visit.  Medication Velasquez effects and possible impairments from medications were discussed as applicable.  ??  Call if pattern of symptoms change or persists for an extended time.    This document was prepared by a combination of typing and transcription through a voice recognition software.    This provider spent 40 minutes in the room with the patient with 50% or greater being utilized on patient education.  Patient educated on causes of tachycardia, echocardiogram, HTN, concussion, neck pain      Tobacco abuse: Patient was counseled about stopping tobacco use. Discussed harmful effects of continued tobacco use including COPD, cardiovascular disease, and/or death. Discussed the increased risk of pneumonia as well as the potential  for substantial decline in lung function. The following recommendations were given to improve chances of quitting:  ??  1)                   Chances of stopping improve with each attempt  ??  2)                   Encourage all other occupants in the house to quit  ??  3)                   Remove all tobacco products from home, work, and vehicles  ??  4)                   Tobacco cessation aids such as nicotine replacement therapy options    It  is very important that she quit smoking. There are various alternatives available to help with this difficult task, but first and foremost, she must make a firm commitment and decision to quit. The nature of nicotine addiction is discussed. The usefulness of behavioral therapy is discussed and suggested.  The correct use, cost and Velasquez effects of nicotine replacement therapy such as gum or patches is discussed. Bupropion and its cost (sometimes not covered fully by insurance) and Velasquez effects are reviewed. The quit rates are discussed. I recommend she not allow potential costs of treatment to deter her from using nicotine replacement therapy or bupropion, as the long term economic and health benefits are obvious.

## 2018-05-08 NOTE — Patient Instructions (Signed)
Patient Education        Concussion: Care Instructions  Your Care Instructions    A concussion is a kind of injury to the brain. It happens when the head receives a hard blow. The impact can jar or shake the brain against the skull. This interrupts the brain's normal activities. Although you may have cuts or bruises on your head or face, you may have no other visible signs of a brain injury. In most cases, damage to the brain from a concussion can't be seen in tests such as a CT or MRI scan.  For a few weeks, you may have low energy, dizziness, trouble sleeping, a headache, ringing in your ears, or nausea. You may also feel anxious, grumpy, or depressed. You may have problems with memory and concentration. These symptoms are common after a concussion. They should slowly improve over time. Sometimes this takes weeks or even months. Someone who lives with you should know how to care for you. Please share this and all information with a caregiver who will be available to help if needed.  Follow-up care is a key part of your treatment and safety. Be sure to make and go to all appointments, and call your doctor if you are having problems. It's also a good idea to know your test results and keep a list of the medicines you take.  How can you care for yourself at home?  Pain control  ?? Put ice or a cold pack on the part of your head that hurts for 10 to 20 minutes at a time. Put a thin cloth between the ice and your skin.  ?? Be safe with medicines. Read and follow all instructions on the label.  ? If the doctor gave you a prescription medicine for pain, take it as prescribed.  ? If you are not taking a prescription pain medicine, ask your doctor if you can take an over-the-counter medicine.  Recovery  ?? Follow your doctor's instructions. He or she will tell you if you need someone to watch you closely for the next 24 hours or longer.  ?? Rest is the best way to recover from a concussion. You need to rest your body and your  brain:  ? Get plenty of sleep at night. And take rest breaks during the day.  ? Avoid activities that take a lot of physical or mental work. This includes housework, exercise, schoolwork, video games, text messaging, and using the computer.  ? You may need to change your school or work schedule while you recover.  ? Return to your normal activities slowly. Do not try to do too much at once.  ?? Do not drink alcohol or use illegal drugs. Alcohol and illegal drugs can slow your recovery. And they can increase your risk of a second brain injury.  ?? Avoid activities that could lead to another concussion. Follow your doctor's instructions for a gradual return to activity and sports.  ?? Ask your doctor when it's okay for you to drive a car, ride a bike, or operate machinery.  How should you return to activity?  Your return to activity can begin after 1 to 2 days of physical and mental rest. After resting, you can gradually increase your activity as long as it does not cause new symptoms or worsen your symptoms.  Doctors and concussion specialists suggest steps to follow for returning to sports after a concussion. Use these steps as a guide. You should slowly progress through the   following levels of activity:  1. Limited activity. You can take part in daily activities as long as the activity doesn't increase your symptoms or cause new symptoms.  2. Light aerobic activity. This can include walking, swimming, or other exercise at less than 70% of maximum heart rate. No resistance training is included in this step.  3. Sport-specific exercise. This includes running drills or skating drills (depending on the sport), but no head impact.  4. Noncontact training drills. This includes more complex training drills such as passing. The athlete may also begin light resistance training.  5. Full-contact practice. The athlete can participate in normal training.  6. Return to normal game play. This is the final step and allows the  athlete to join in normal game play.  Watch and keep track of your progress. It should take at least 6 days for you to go from light activity to normal game play.  Make sure that you can stay at each new level of activity for at least 24 hours without symptoms, or as long as your doctor says, before doing more. If one or more symptoms come back, return to a lower level of activity for at least 24 hours. Don't move on until all symptoms are gone.  When should you call for help?  Call 911 anytime you think you may need emergency care. For example, call if:  ?? ?? You have a seizure.   ?? ?? You passed out (lost consciousness).   ?? ?? You are confused or can't stay awake.   ??Call your doctor now or seek immediate medical care if:  ?? ?? You have new or worse vomiting.   ?? ?? You feel less alert.   ?? ?? You have new weakness or numbness in any part of your body.   ??Watch closely for changes in your health, and be sure to contact your doctor if:  ?? ?? You do not get better as expected.   ?? ?? You have new symptoms, such as headaches, trouble concentrating, or changes in mood.   Where can you learn more?  Go to [email or web address] and sign in to your MyChart account. Enter Z711 in the Search Health Information box to learn more about "Concussion: Care Instructions."     If you do not have an account, please click on the "Sign Up Now" link.  Current as of: August 08, 2017  Content Version: 12.1  ?? 2006-2019 [employer]. Care instructions adapted under license by [facility]. If you have questions about a medical condition or this instruction, always ask your healthcare professional. [employer] disclaims any warranty or liability for your use of this information.

## 2018-05-27 ENCOUNTER — Encounter

## 2018-05-27 ENCOUNTER — Inpatient Hospital Stay: Admit: 2018-05-27 | Payer: BLUE CROSS/BLUE SHIELD

## 2018-05-27 DIAGNOSIS — R000 Tachycardia, unspecified: Secondary | ICD-10-CM

## 2018-05-27 LAB — ECHOCARDIOGRAM COMPLETE 2D W DOPPLER W COLOR: Left Ventricular Ejection Fraction: 58

## 2018-05-27 MED ORDER — ATORVASTATIN CALCIUM 20 MG PO TABS
20 MG | ORAL_TABLET | Freq: Every day | ORAL | 5 refills | Status: DC
Start: 2018-05-27 — End: 2018-11-03

## 2018-05-27 MED ORDER — METHYLPREDNISOLONE 4 MG PO TBPK
4 MG | PACK | ORAL | 0 refills | Status: AC
Start: 2018-05-27 — End: 2018-06-02

## 2018-06-01 ENCOUNTER — Encounter

## 2018-06-02 MED ORDER — DICLOFENAC SODIUM 75 MG PO TBEC
75 MG | ORAL_TABLET | ORAL | 5 refills | Status: DC
Start: 2018-06-02 — End: 2019-01-27

## 2018-06-02 NOTE — Telephone Encounter (Signed)
Was seen 01/30/18 for CC and couple of acute visits since and has a February appointment.

## 2018-06-12 ENCOUNTER — Encounter: Payer: Auto Insurance (includes no fault) | Attending: Surgical

## 2018-06-12 ENCOUNTER — Encounter

## 2018-06-12 ENCOUNTER — Inpatient Hospital Stay: Admit: 2018-06-12 | Payer: Auto Insurance (includes no fault)

## 2018-06-12 ENCOUNTER — Inpatient Hospital Stay: Payer: Auto Insurance (includes no fault)

## 2018-06-12 DIAGNOSIS — M47812 Spondylosis without myelopathy or radiculopathy, cervical region: Secondary | ICD-10-CM

## 2018-06-26 ENCOUNTER — Ambulatory Visit: Admit: 2018-06-26 | Discharge: 2018-06-26 | Payer: BLUE CROSS/BLUE SHIELD | Attending: Surgical

## 2018-06-26 DIAGNOSIS — S161XXA Strain of muscle, fascia and tendon at neck level, initial encounter: Secondary | ICD-10-CM
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
## 2018-07-03 ENCOUNTER — Inpatient Hospital Stay: Admit: 2018-07-03 | Payer: BLUE CROSS/BLUE SHIELD

## 2018-07-03 DIAGNOSIS — S161XXD Strain of muscle, fascia and tendon at neck level, subsequent encounter: Secondary | ICD-10-CM

## 2018-07-03 NOTE — Progress Notes (Signed)
Outpatient Physical Therapy     [x]  Daily Treatment Note   []  Progress Note   []  Discharge Note    Date:  07/03/2018    Patient Name:  Tanya Velasquez         Date of Birth:   31-May-1965    Medical Diagnosis:   Cervical strain, Strain of neck muscle, initial encounter                                         ICD 10:  S16.1XXA    Treatment Diagnosis: Cervicothoracic dysfunction and hypomobility                                     Onset Date:    Dec 3rd, 2019     Referral Date:  06/26/2018        Referring Physician: Mariane Baumgarten, PA                                                    Visits Allowed/Insurance/Certification Information: [employer]/ 1-2 per week for 4-6 weeks/ 20 visits per year    Restrictions/Precautions: None    Plan of care sent to provider:      [] Faxed  [x] Co-signature    (attempts: 1[x]  2 [] 3[] )         Plan of care signed:      [] Yes date:            [] No      Progress Note covers period from (if applicable):    [x] NA    [] From          To           Next Progress Note due:   07/25/2018     Visit# / total visits:  1/8    Plan for Next Session:  Mobilization to upper thoracic spine as needed, therapeutic exercise progression as tolerated.    Subjective:  See eval     Pain level:   AT EVAL: Patient describes pain to be dull aching with headaches not specific for positions   Patient reports pain is  4/10 pain at present and  6/10 pain at its worst.  Worsened by looking to right and looking up.    Improved by lie down, pain pump in the back. Ibuprofen      Objective:       Exercises:    Exercises in bold performed in department today.  Items not bolded are carried forward from prior visits for continuity of the record.  Exercise/Equipment Resistance/Repetitions HEP Other comments     Chin tuck in supine 10 reps x 2 sets [x]       Cervical neck ROM in supine and sitting  10 reps x 2 sets in all available planes [x]       Scapular retraction in prone 10 reps x  2 sets [x]       Thoracic expansion  with flexion and extension with breathing control 10 reps x 2 sets [x]       Wall angels 10 reps x 2sets [x]       Thoracic spine stretch with pillow or towel roll under the  chest while lying prone 5 min [x]         []         []         []           []          []         []         []         []         []         []         []         []       Therapeutic Exercise/Home Exercise Program:   30 minutes  Pt inst in role of PT, prognosis, plan of care, activity modification, and benefits of therapy.  HEP established, See above, pt given handout(s). No adverse effects noted post session.    Group Therapy:    0 minutes    Therapeutic Activity:  0 minutes     Gait: 0 minutes    Neuromuscular Re-Education:  0 minutes      Canalith Repositioning Procedure:  0 minutes    Manual Therapy:  15 minutes  Patient was given cervicothoracic and upper thoracic distraction to improve sagittal plane of ROM of cervical spine. Audible cavitations with improved sagittal plane of cervical ROM from 95 degrees to 105 degrees.   Trigger point release for upper trapezius bilateral  MFR for Rhomboids and STR for thoracic spine soft tissue.    Modalities: 0 minutes    Functional Outcome Measure:   [] NA  Measure Used: Neck disability index  Date Assessed: 07/03/2018  Score: 22/50, 44% disability    Assessment/Treatment/Activity Tolerance:    Patient's response to treatment: patient showed decreased cervical pain from 4/10 to 2/10 on VAS.  [x] Patient tolerated treatment well [] Patient limited by fatigue   [] Patient limited by pain  [] Patient limited by other medical complications   [] Other:     Goals:   Progress towards goals:  Goals established on 07/03/18   Short Term Goals:  2   weeks Long Term Goals:   4  weeks   1).  Establish HEP 1).  Pt independent with HEP   2).  Pain  4/10 or less 2).  Pain 2/10 or less   3).  Patient will demonstrate improved cervical ROM by 10 degrees 3). Patient will have normal cervical ROM    4). Patient will present with lower  scores of neck disability index to 35% 4).  Patient will present with lower scores of neck disability index to 30%   5). 5).   6). 6).       Prognosis: [x] Good   [] Fair   [] Poor    Patient Requires Follow-up:  [x] Yes  [] No    Plan: [x] Plan of care initiated     [] Continue per plan of care    []  Alter current plan (see comments)    [] Hold pending MD visit [] Discharge    Timed Code Treatment Minutes:  45 + eval    Total Treatment Minutes:  60    Medicare Cap total YTD:   [] N/A    Workers Comp Time Stamp  [x] N/A   Time In:   Time Out:    Electronically signed by:  Henri Medal, [id number]
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
## 2018-07-03 NOTE — Other (Signed)
Letter of protection sent by [employer] regarding PT billing
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
## 2018-07-07 NOTE — Patient Instructions (Signed)
Patient Education        Headache: Care Instructions  Your Care Instructions    Headaches have many possible causes. Most headaches aren't a sign of a more serious problem, and they will get better on their own. Home treatment may help you feel better faster.  The doctor has checked you carefully, but problems can develop later. If you notice any problems or new symptoms, get medical treatment right away.  Follow-up care is a key part of your treatment and safety. Be sure to make and go to all appointments, and call your doctor if you are having problems. It's also a good idea to know your test results and keep a list of the medicines you take.  How can you care for yourself at home?  ?? Do not drive if you have taken a prescription pain medicine.  ?? Rest in a quiet, dark room until your headache is gone. Close your eyes and try to relax or go to sleep. Don't watch TV or read.  ?? Put a cold, moist cloth or cold pack on the painful area for 10 to 20 minutes at a time. Put a thin cloth between the cold pack and your skin.  ?? Use a warm, moist towel or a heating pad set on low to relax tight shoulder and neck muscles.  ?? Have someone gently massage your neck and shoulders.  ?? Take pain medicines exactly as directed.  ? If the doctor gave you a prescription medicine for pain, take it as prescribed.  ? If you are not taking a prescription pain medicine, ask your doctor if you can take an over-the-counter medicine.  ?? Be careful not to take pain medicine more often than the instructions allow, because you may get worse or more frequent headaches when the medicine wears off.  ?? Do not ignore new symptoms that occur with a headache, such as a fever, weakness or numbness, vision changes, or confusion. These may be signs of a more serious problem.  To prevent headaches  ?? Keep a headache diary so you can figure out what triggers your headaches. Avoiding triggers may help you prevent headaches. Record when each headache began,  how long it lasted, and what the pain was like (throbbing, aching, stabbing, or dull). Write down any other symptoms you had with the headache, such as nausea, flashing lights or dark spots, or sensitivity to bright light or loud noise. Note if the headache occurred near your period. List anything that might have triggered the headache, such as certain foods (chocolate, cheese, wine) or odors, smoke, bright light, stress, or lack of sleep.  ?? Find healthy ways to deal with stress. Headaches are most common during or right after stressful times. Take time to relax before and after you do something that has caused a headache in the past.  ?? Try to keep your muscles relaxed by keeping good posture. Check your jaw, face, neck, and shoulder muscles for tension, and try relaxing them. When sitting at a desk, change positions often, and stretch for 30 seconds each hour.  ?? Get plenty of sleep and exercise.  ?? Eat regularly and well. Long periods without food can trigger a headache.  ?? Treat yourself to a massage. Some people find that regular massages are very helpful in relieving tension.  ?? Limit caffeine by not drinking too much coffee, tea, or soda. But don't quit caffeine suddenly, because that can also give you headaches.  ?? Reduce eyestrain from   computers by blinking frequently and looking away from the computer screen every so often. Make sure you have proper eyewear and that your monitor is set up properly, about an arm's length away.  ?? Seek help if you have depression or anxiety. Your headaches may be linked to these conditions. Treatment can both prevent headaches and help with symptoms of anxiety or depression.  When should you call for help?  Call 911 anytime you think you may need emergency care. For example, call if:  ?? ?? You have signs of a stroke. These may include:  ? Sudden numbness, paralysis, or weakness in your face, arm, or leg, especially on only one side of your body.  ? Sudden vision  changes.  ? Sudden trouble speaking.  ? Sudden confusion or trouble understanding simple statements.  ? Sudden problems with walking or balance.  ? A sudden, severe headache that is different from past headaches.   ??Call your doctor now or seek immediate medical care if:  ?? ?? You have a new or worse headache.   ?? ?? Your headache gets much worse.   Where can you learn more?  Go to [email or web address] and sign in to your MyChart account. Enter M271 in the Search Health Information box to learn more about "Headache: Care Instructions."     If you do not have an account, please click on the "Sign Up Now" link.  Current as of: August 08, 2017  Content Version: 12.3  ?? 2006-2019 [employer]. Care instructions adapted under license by [facility]. If you have questions about a medical condition or this instruction, always ask your healthcare professional. [employer] disclaims any warranty or liability for your use of this information.

## 2018-07-08 ENCOUNTER — Inpatient Hospital Stay: Admit: 2018-07-08 | Payer: BLUE CROSS/BLUE SHIELD

## 2018-07-08 ENCOUNTER — Ambulatory Visit: Admit: 2018-07-08 | Discharge: 2018-07-08 | Payer: BLUE CROSS/BLUE SHIELD | Attending: Registered Nurse

## 2018-07-08 DIAGNOSIS — F0781 Postconcussional syndrome: Secondary | ICD-10-CM

## 2018-07-08 LAB — COMPREHENSIVE METABOLIC PANEL
ALT: 30 U/L (ref 10–40)
AST: 24 U/L (ref 15–37)
Albumin/Globulin Ratio: 1.7 (ref 1.1–2.2)
Albumin: 4.8 g/dL (ref 3.4–5.0)
Alkaline Phosphatase: 64 U/L (ref 40–129)
Anion Gap: 16 (ref 3–16)
BUN: 14 mg/dL (ref 7–20)
CO2: 25 mmol/L (ref 21–32)
Calcium: 10.1 mg/dL (ref 8.3–10.6)
Chloride: 99 mmol/L (ref 99–110)
Creatinine: 0.8 mg/dL (ref 0.6–1.1)
GFR African American: >60 (ref >60)
GFR Non-African American: >60 (ref >60)
Globulin: 2.8 g/dL
Glucose: 86 mg/dL (ref 70–99)
Potassium: 4.6 mmol/L (ref 3.5–5.1)
Sodium: 140 mmol/L (ref 136–145)
Total Bilirubin: 0.3 mg/dL (ref 0.0–1.0)
Total Protein: 7.6 g/dL (ref 6.4–8.2)

## 2018-07-08 LAB — LIPID PANEL
Cholesterol, Total: 188 mg/dL (ref 0–199)
HDL: 92 mg/dL — ABNORMAL HIGH (ref 40–60)
LDL Calculated: 71 mg/dL (ref <100)
Triglycerides: 127 mg/dL (ref 0–150)
VLDL Cholesterol Calculated: 25 mg/dL

## 2018-07-08 LAB — CK: Total CK: 177 U/L (ref 26–192)

## 2018-07-08 MED ORDER — ATENOLOL 25 MG PO TABS
25 MG | ORAL_TABLET | Freq: Every day | ORAL | 3 refills | Status: DC
Start: 2018-07-08 — End: 2018-11-03

## 2018-07-08 NOTE — Progress Notes (Signed)
Outpatient Physical Therapy     [x]  Daily Treatment Note   []  Progress Note   []  Discharge Note    Date:  07/08/2018    Patient Name:  Tanya Velasquez         Date of Birth:   10/09/1965    Medical Diagnosis:   Cervical strain, Strain of neck muscle, initial encounter                                         ICD 10:  S16.1XXA  ??  Treatment Diagnosis: Cervicothoracic dysfunction and hypomobility                                   ??  Onset Date:    Dec 3rd, 2019   ??  Referral Date:  06/26/2018      ??  Referring Physician: Mariane Baumgarten, [place]                                                    Visits Allowed/Insurance/Certification Information: Anthem/ 1-2 per week for 4-6 weeks/ 20 visits per year  ??  Restrictions/Precautions: None    Plan of care sent to provider:      [] Faxed  [x] Co-signature    (attempts: 1[x]  2 [] 3[] )         Plan of care signed:      [x] Yes date: 07/04/2018    [] No      Progress Note covers period from (if applicable):    [x] NA    [] From          To           Next Progress Note due:   07/25/2018     Visit# / total visits:  2/8    Plan for Next Session:  Mobilization to upper thoracic spine as needed, therapeutic exercise progression as tolerated.    Subjective:  Patient presented today with mild neck pain at the cervicothoracic junction and T6 L lateral facet joint region. Patient reported decrease in neck pain since last session better neck movements and decrease in headaches which were sharp shooting. Patient had follow up visit with her PCP and will have neurology follow up for post concussion headaches.      Pain level: 4/10 on T6 on the L facetal joint and T2 L facetal joint. Aching type of pain.  Pain worsened with neck extension and R side bending.   AT EVAL: Patient describes pain to be dull aching with headaches not specific for positions   Patient reports pain is  4/10 pain at present and  6/10 pain at its worst.  Worsened by looking to right and looking up.    Improved by lie down,  pain pump in the back. Ibuprofen      Objective:       Exercises:    Exercises in bold performed in department today.  Items not bolded are carried forward from prior visits for continuity of the record.  Exercise/Equipment Resistance/Repetitions HEP Other comments     Chin tuck in supine 10 reps x 2 sets [x]       Cervical neck ROM in supine and  sitting  10 reps x 2 sets in all available planes       Scapular retraction in prone 10 reps x  2 sets       Thoracic expansion with flexion and extension with breathing control 10 reps x 2 sets       Wall angels in supine and In supported standing against the wall 15 reps x 2 sets       Thoracic spine stretch with pillow or towel roll under the chest while lying prone 5 min       Chin tucks in prone 10 reps x 2sets     Upper trapezius stretching in sitting and supine 30 sec hold x 3 reps x B/L       Prone T hold 10 sec hold x 10 reps       Prone Y hold   10 sec hold x 10 reps                                                                        Therapeutic Exercise/Home Exercise Program:   30 minutes  Cueing needed for technique of the exercises.  Pt inst in role of PT, prognosis, plan of care, activity modification, and benefits of therapy.  HEP established, See above, pt given handout(s). No adverse effects noted post session.    Group Therapy:    0 minutes    Therapeutic Activity:  0 minutes     Gait: 0 minutes    Neuromuscular Re-Education:  0 minutes      Canalith Repositioning Procedure:  0 minutes    Manual Therapy:  15 minutes  Patient was given cervicothoracic and upper thoracic distraction to improve sagittal plane of ROM of cervical spine. Audible cavitations with improved sagittal plane of cervical ROM from 100 degrees to 112 degrees.   Trigger point release for upper trapezius bilateral  Cervical spine manual traction for 2 min hold for 2 reps    Cervical flexion 60 degrees --> range improved to 67 degrees  Cervical  extension 40 degrees --> range improved to 45 degrees  R side bending 40 degrees --> range improved to 45 degrees  L side bending 45 degrees.   Patient had full cervical rotation range with decrease to no pain with R side bending and R cervical rotation at the end of the session.    Modalities: 0 minutes    Functional Outcome Measure:   NA  Measure Used: Neck disability index  Date Assessed: 07/03/2018  Score: 22/50, 44% disability    Assessment/Treatment/Activity Tolerance:    Patient???s response to treatment: patient showed decreased cervical pain from 4/10 to 2/10 on VAS.  Patient tolerated treatment well Patient limited by fatigue   Patient limited by pain  Patient limited by other medical complications   Other:     Goals:   Progress towards goals:  Goals established on 07/03/18   Short Term Goals:  2   weeks Long Term Goals:   4  weeks   1).  Establish HEP 1).  Pt independent with HEP   2).  Pain  4/10 or less 2).  Pain 2/10 or less   3).  Patient will demonstrate improved cervical ROM by 10  degrees 3). Patient will have normal cervical ROM    4). Patient will present with lower scores of neck disability index to 35% 4).  Patient will present with lower scores of neck disability index to 30%   5). 5).   6). 6).       Prognosis: [x] Good   [] Fair   [] Poor    Patient Requires Follow-up:  [x] Yes  [] No    Plan: [] Plan of care initiated     [x] Continue per plan of care    []  Alter current plan (see comments)    [] Hold pending MD visit [] Discharge    Timed Code Treatment Minutes:  45    Total Treatment Minutes: 45    Medicare Cap total YTD:   [] N/A    Workers Comp Time Stamp  [x] N/A   Time In:   Time Out:    Electronically signed by:  Henri Medal, [id number]

## 2018-07-09 ENCOUNTER — Encounter

## 2018-07-09 MED ORDER — VARENICLINE TARTRATE 0.5 MG X 11 & 1 MG X 42 PO MISC
0.5 MG X 11 & 1 MG X 42 | ORAL | 0 refills | Status: DC
Start: 2018-07-09 — End: 2019-01-27

## 2018-07-09 MED ORDER — ESCITALOPRAM OXALATE 20 MG PO TABS
20 MG | ORAL_TABLET | ORAL | 5 refills | Status: DC
Start: 2018-07-09 — End: 2019-01-27

## 2018-07-09 NOTE — Telephone Encounter (Signed)
Next appt 11/11/2018

## 2018-07-10 ENCOUNTER — Inpatient Hospital Stay: Admit: 2018-07-10 | Payer: BLUE CROSS/BLUE SHIELD

## 2018-07-10 NOTE — Progress Notes (Signed)
Outpatient Physical Therapy     [x]  Daily Treatment Note   []  Progress Note   []  Discharge Note    Date:  07/10/2018    Patient Name:  Tanya Velasquez         Date of Birth:   Mar 04, 1966    Medical Diagnosis:   Cervical strain, Strain of neck muscle, initial encounter                                         ICD 10:  S16.1XXA    Treatment Diagnosis: Cervicothoracic dysfunction and hypomobility                                     Onset Date:    Dec 3rd, 2019     Referral Date:  06/26/2018        Referring Physician: Mariane Baumgarten, PA                                                    Visits Allowed/Insurance/Certification Information: Anthem/ 1-2 per week for 4-6 weeks/ 20 visits per year    Restrictions/Precautions: None    Plan of care sent to provider:      [] Faxed  [x] Co-signature    (attempts: 1[x]  2 [] 3[] )         Plan of care signed:      [x] Yes date: 07/04/2018    [] No      Progress Note covers period from (if applicable):    [x] NA    [] From          To           Next Progress Note due:   07/25/2018     Visit# / total visits:  3/8    Plan for Next Session:  Mobilization to upper thoracic spine as needed, therapeutic exercise progression as tolerated.    Subjective:  Patient presented today with R upper trapezius today with no upper thoracic pain. Patient reported decrease in the headaches.   Patient had follow up visit with her PCP and will have neurology follow up for post concussion headaches.      Pain level: 3/10 on R upper trapezius, headaches 5/10 more from back of the neck going to the back of the head.   Worse possible neck pain is 4/10     AT EVAL: Patient describes pain to be dull aching with headaches not specific for positions   Patient reports pain is  4/10 pain at present and  6/10 pain at its worst.  Worsened by looking to right and looking up.    Improved by lie down, pain pump in the back. Ibuprofen      Objective:       Exercises:    Exercises in bold performed in department today.   Items not bolded are carried forward from prior visits for continuity of the record.  Exercise/Equipment Resistance/Repetitions HEP Other comments     Chin tuck in supine 10 reps x 2 sets [x]       Cervical neck ROM in supine and sitting  10 reps x 2 sets in all available planes [x]   Scapular retraction in prone 10 reps x  2 sets [x]       Thoracic expansion with flexion and extension with breathing control 10 reps x 2 sets [x]       Wall angels in supine and In supported standing against the wall 15 reps x 2 sets [x]       Thoracic spine stretch with pillow or towel roll under the chest while lying prone 5 min [x]       Chin tucks in prone 10 reps x 2sets [x]     Upper trapezius stretching in sitting and supine 30 sec hold x 3 reps x B/L [x]       Prone T hold with 1# B/L 10 sec hold x 10 reps [x]       Prone Y hold  With 1# B/L 10 sec hold x 10 reps [x]        Standing Row pulls from front and overhead position with red band 15 reps in each position x 1 set  []       Thoracic spine extension with arms across the chest from head down position  10 reps x 2sets []         []         []         []         []         []         []       Therapeutic Exercise/Home Exercise Program:   30 minutes  Cueing needed for technique of the exercises.  Pt inst in role of PT, prognosis, plan of care, activity modification, and benefits of therapy.  HEP established, See above, pt given handout(s). No adverse effects noted post session.    Group Therapy:    0 minutes    Therapeutic Activity:  0 minutes     Gait: 0 minutes    Neuromuscular Re-Education:  0 minutes      Canalith Repositioning Procedure:  0 minutes    Manual Therapy:  10 minutes  Patient was given cervicothoracic and upper thoracic distraction to improve sagittal plane of ROM of cervical spine. Audible cavitations with improved sagittal plane of cervical ROM from 113 degrees to 130 degrees.   Trigger point release for upper trapezius bilateral  Cervical spine manual traction for 2  min hold for 2 reps  MFR for Rhomboids and STR for thoracic spine soft tissue  Cervical flexion 55 degrees --> range improved to 70 degrees  Cervical extension 58 degrees --> range improved to 60 degrees  Cervical side bending and rotations are WNL    Modalities: 0 minutes    Functional Outcome Measure:   [] NA  Measure Used: Neck disability index  Date Assessed: 07/03/2018  Score: 22/50, 44% disability    Assessment/Treatment/Activity Tolerance:    Patient's response to treatment: patient showed decreased cervical pain from 3/10 to 1/10 on VAS.  [x] Patient tolerated treatment well [] Patient limited by fatigue   [] Patient limited by pain  [] Patient limited by other medical complications   [] Other:     Goals:   Progress towards goals:  Goals established on 07/03/18   Short Term Goals:  2   weeks Long Term Goals:   4  weeks   1).  Establish HEP 1).  Pt independent with HEP   2).  Pain  4/10 or less 2).  Pain 2/10 or less   3).  Patient will demonstrate improved cervical ROM by 10 degrees 3). Patient will have normal cervical  ROM    4). Patient will present with lower scores of neck disability index to 35% 4).  Patient will present with lower scores of neck disability index to 30%   5). 5).   6). 6).       Prognosis: [x] Good   [] Fair   [] Poor    Patient Requires Follow-up:  [x] Yes  [] No    Plan: [] Plan of care initiated     [x] Continue per plan of care    []  Alter current plan (see comments)    [] Hold pending MD visit [] Discharge    Timed Code Treatment Minutes:  40    Total Treatment Minutes: 40    Medicare Cap total YTD:   [] N/A    Workers Comp Time Stamp  [x] N/A   Time In:   Time Out:    Electronically signed by:  Henri Medal, [id number]

## 2018-07-14 ENCOUNTER — Inpatient Hospital Stay: Admit: 2018-07-14 | Payer: BLUE CROSS/BLUE SHIELD

## 2018-07-14 DIAGNOSIS — S161XXD Strain of muscle, fascia and tendon at neck level, subsequent encounter: Secondary | ICD-10-CM

## 2018-07-14 NOTE — Progress Notes (Signed)
Outpatient Physical Therapy      Daily Treatment Note    Progress Note    Discharge Note    Date:  07/14/2018    Patient Name:  Tanya Velasquez         Date of Birth:   [birth year]    Medical Diagnosis:   Cervical strain, Strain of neck muscle, initial encounter                                         ICD 10:  S16.1XXA  ??  Treatment Diagnosis: Cervicothoracic dysfunction and hypomobility                                   ??  Onset Date:    Dec 3rd, 2019     Referral Date:  06/26/2018      ??  Referring Physician: Mariane Baumgarten, PA                                                    Visits Allowed/Insurance/Certification Information: Anthem/ 1-2 per week for 4-6 weeks/ 20 visits per year  ??  Restrictions/Precautions: None    Plan of care sent to provider:      Faxed  Co-signature    (attempts: 1[x]  2 3[] )         Plan of care signed:      Yes date: 07/04/2018    No      Progress Note covers period from (if applicable):    NA    From          To           Next Progress Note due:   07/25/2018     Visit# / total visits:  4/8    Plan for Next Session:  Mobilization to cervical and thoracic spine as needed, therapeutic exercise progression as tolerated.    Subjective:    Pt is not scheduled to return to [place] but is to call if therapy is not helping for possible cervical MRI   Pt is scheduled with neurology in April with Valentino Hue for concussion.   Pt states no trouble with HEP.  Pt complains of LB pain today, states she does have a history of disc/degeneration and it flares up at times.   Plans to go home and take muscle relaxers.         Pain level: No cervical, thoracic or HA pain currently.  Pt did have some HA (occipital) and neck stiffness over weekend, 3/10 at the most.  Pt complains of L LS pain today, 6/10 currently.    AT EVAL: Patient describes pain to be dull aching with headaches not specific for positions   Patient reports pain is  4/10 pain at present and  6/10 pain at its  worst.  Worsened by looking to right and looking up.    Improved by lie down, pain pump in the back. Ibuprofen      Objective:       Exercises:    Exercises in bold performed in department today.  Items not bolded are carried forward from prior visits for continuity of the record.  Exercise/Equipment Resistance/Repetitions HEP Other comments     Chin tuck in supine 10 reps x 2 sets [x]       Cervical neck ROM in supine and sitting  10 reps x 2 sets in all available planes [x]       Scapular retraction in prone 10 reps x  2 sets [x]       Thoracic expansion with flexion and extension with breathing control 10 reps x 2 sets [x]       Wall angels in supine and In supported standing against the wall 15 reps x 2 sets [x]       Thoracic spine stretch with pillow or towel roll under the chest while lying prone 5 min [x]       Chin tucks in prone 10 reps x 2sets [x]     Upper trapezius stretching in sitting and supine 30 sec hold x 3 reps x B/L [x]       Prone T hold with 1# B/L 10 sec hold x 10 reps [x]       Prone Y hold  With 1# B/L 10 sec hold x 10 reps [x]        Standing Row pulls from front and overhead position with red band 15 reps in each position x 1 set  []       Thoracic spine extension with arms across the chest from head down position  10 reps x 2sets []         []         []         []         []         []         []       Therapeutic Exercise/Home Exercise Program:   10 minutes  This PT reviewed pt history and evaluation  HEP established, See above, verbally reviewed, no new ex today due to LBP.     Group Therapy:    0 minutes    Therapeutic Activity:  0 minutes     Gait: 0 minutes    Neuromuscular Re-Education:  0 minutes      Canalith Repositioning Procedure:  0 minutes    Manual Therapy:  30 minutes  Mid Thoracic mobilization prone PAs Gr I-II  B costotransverse springing prone Gr I-III  T1-4 mobilization prone PAs Gr I-II  Prone 2nd rib mob L, Gr III-IV  Supine 1st rib mobs B, L > R, Gr III-IV  Cervical  traction  SOR  OA dist  AA rot ROM only B     Modalities: 0 minutes    Functional Outcome Measure:   [] NA  Measure Used: Neck disability index  Date Assessed: 07/03/2018  Score: 22/50, 44% disability    Assessment/Treatment/Activity Tolerance:     Patient???s response to treatment: No complaints, no pain after, improved relaxation and ROM after   [x] Patient tolerated treatment well [] Patient limited by fatigue   [] Patient limited by pain  [] Patient limited by other medical complications   [] Other:     Goals:   Progress towards goals:  Goals established on 07/03/18   Short Term Goals:  2   weeks Long Term Goals:   4  weeks   1).  Establish HEP 1).  Pt independent with HEP   2).  Pain  4/10 or less 2).  Pain 2/10 or less   3).  Patient will demonstrate improved cervical ROM by 10 degrees 3). Patient will have normal cervical ROM    4). Patient will  present with lower scores of neck disability index to 35% 4).  Patient will present with lower scores of neck disability index to 30%   5). 5).   6). 6).       Prognosis: [x] Good   [] Fair   [] Poor    Patient Requires Follow-up:  [x] Yes  [] No    Plan: [] Plan of care initiated     [x] Continue per plan of care    []  Alter current plan (see comments)    [] Hold pending MD visit [] Discharge    Timed Code Treatment Minutes:  40    Total Treatment Minutes: 40    Medicare Cap total YTD:   [x] N/A    Workers Comp Time Stamp  [x] N/A   Time In:   Time Out:    Electronically signed by:  Lynita Lombard, PT, OMT-C, [phone]

## 2018-07-16 ENCOUNTER — Inpatient Hospital Stay: Admit: 2018-07-16 | Payer: BLUE CROSS/BLUE SHIELD

## 2018-07-16 NOTE — Progress Notes (Signed)
Outpatient Physical Therapy      Daily Treatment Note    Progress Note    Discharge Note    Date:  07/16/2018    Patient Name:  Tanya Velasquez         Date of Birth:   16-Aug-1965    Medical Diagnosis:   Cervical strain, Strain of neck muscle, initial encounter                                         ICD 10:  S16.1XXA  ??  Treatment Diagnosis: Cervicothoracic dysfunction and hypomobility                                   ??  Onset Date:    Dec 3rd, 2019     Referral Date:  06/26/2018      ??  Referring Physician: Mariane Baumgarten, PA                                                    Visits Allowed/Insurance/Certification Information: Anthem/ 1-2 per week for 4-6 weeks/ 20 visits per year  ??  Restrictions/Precautions: None    Plan of care sent to provider:      Faxed  Co-signature    (attempts: 1[x]  2 3[] )         Plan of care signed:      Yes date: 07/04/2018    No      Progress Note covers period from (if applicable):    NA    From          To           Next Progress Note due:   By 07/31/2018     Visit# / total visits:  4/8    Plan for Next Session:  Mobilization to cervical and thoracic spine as needed, therapeutic exercise progression as tolerated.    Subjective:    Pt states she is doing some of HEP.  Pt still complains of LB pain today, but improving.  Pt states she will be seeing chiropractor at her pain clinic for LBP   Pt going out of town next week, will return to PT on 07/29/18      Pain level: Cervical / thoracic pain currently 3/10.  No HA today.  Neck pain as high as 4/10 since last visit.  Pt did have HA yesterday.   L LS pain today, 4/10 currently.    AT EVAL: Patient describes pain to be dull aching with headaches not specific for positions   Patient reports pain is  4/10 pain at present and  6/10 pain at its worst.  Worsened by looking to right and looking up.    Improved by lie down, pain pump in the back. Ibuprofen      Objective:       Exercises:    Exercises in bold  performed in department today.  Items not bolded are carried forward from prior visits for continuity of the record.  Exercise/Equipment Resistance/Repetitions HEP Other comments     Chin tuck in supine 10 reps x 2 sets       Cervical neck ROM  in supine and sitting  10 reps x 2 sets in all available planes [x]       Scapular retraction in prone 10 reps x  2 sets [x]       Thoracic expansion with flexion and extension with breathing control 10 reps x 2 sets [x]       Wall angels in supine and In supported standing against the wall 15 reps x 2 sets [x]       Thoracic spine stretch with pillow or towel roll under the chest while lying prone 5 min [x]       Chin tucks in prone 10 reps x 2sets [x]     Upper trapezius stretching in sitting and supine 30 sec hold x 3 reps x B/L [x]       Prone T hold with 1# B/L 10 sec hold x 10 reps [x]       Prone Y hold  With 1# B/L 10 sec hold x 10 reps [x]        Standing Row pulls from front and overhead position with red band 15 reps in each position x 1 set  []       Thoracic spine extension with arms across the chest from head down position  10 reps x 2sets []         []         []         []         []         []         []       Therapeutic Exercise/Home Exercise Program:   15 minutes  HEP has been established, See above, reviewed exs above in bold, pt inst to bring in her ex folder for PT to review exs, especially ones pt does not seem to remember.     Group Therapy:    0 minutes    Therapeutic Activity:  0 minutes     Gait: 0 minutes    Neuromuscular Re-Education:  0 minutes      Canalith Repositioning Procedure:  0 minutes    Manual Therapy:  30 minutes  Mid Thoracic mobilization prone PAs Gr I-III  B costotransverse springing prone Gr I-III  T1-4 mobilization prone PAs Gr I-III  Prone 2nd rib mob L, Gr III-IV  Supine 1st rib mobs B, L > R, Gr III-IV  Cervical traction  SOR  OA dist    Modalities: 0 minutes    Functional Outcome Measure:   [] NA  Measure Used: Neck disability index  Date  Assessed: 07/03/2018  Score: 22/50, 44% disability    Assessment/Treatment/Activity Tolerance:     Patient???s response to treatment: No complaints, no pain after  [x] Patient tolerated treatment well [] Patient limited by fatigue   [] Patient limited by pain  [] Patient limited by other medical complications   [] Other:     Goals:   Progress towards goals:  Goals established on 07/03/18   Short Term Goals:  2   weeks Long Term Goals:   4  weeks   1).  Establish HEP 1).  Pt independent with HEP   2).  Pain  4/10 or less 2).  Pain 2/10 or less   3).  Patient will demonstrate improved cervical ROM by 10 degrees 3). Patient will have normal cervical ROM    4). Patient will present with lower scores of neck disability index to 35% 4).  Patient will present with lower scores of neck disability index to 30%   5). 5).  6). 6).       Prognosis: [x] Good   [] Fair   [] Poor    Patient Requires Follow-up:  [x] Yes  [] No    Plan: [] Plan of care initiated     [x] Continue per plan of care    []  Alter current plan (see comments)    [] Hold pending MD visit [] Discharge    Timed Code Treatment Minutes:  45    Total Treatment Minutes: 45    Medicare Cap total YTD:   [x] N/A    Workers Comp Time Stamp  [x] N/A   Time In:   Time Out:    Electronically signed by:  Lynita Lombard, PT, OMT-C, [phone]

## 2018-07-17 ENCOUNTER — Inpatient Hospital Stay: Payer: BLUE CROSS/BLUE SHIELD

## 2018-07-17 ENCOUNTER — Inpatient Hospital Stay: Admit: 2018-07-17 | Payer: BLUE CROSS/BLUE SHIELD

## 2018-07-17 ENCOUNTER — Encounter

## 2018-07-17 DIAGNOSIS — R52 Pain, unspecified: Secondary | ICD-10-CM

## 2018-07-28 ENCOUNTER — Ambulatory Visit: Admit: 2018-07-28 | Discharge: 2018-07-28 | Payer: BLUE CROSS/BLUE SHIELD | Attending: Registered Nurse

## 2018-07-28 DIAGNOSIS — N390 Urinary tract infection, site not specified: Secondary | ICD-10-CM

## 2018-07-28 LAB — POCT URINALYSIS DIPSTICK
Bilirubin, UA: NEGATIVE
Glucose, UA POC: NEGATIVE
Ketones, UA: NEGATIVE
Nitrite, UA: NEGATIVE
Protein, UA POC: NEGATIVE
Spec Grav, UA: 1.015
Urobilinogen, UA: 0.2
pH, UA: 6.5

## 2018-07-28 MED ORDER — SULFAMETHOXAZOLE-TRIMETHOPRIM 800-160 MG PO TABS
800-160 MG | ORAL_TABLET | Freq: Two times a day (BID) | ORAL | 0 refills | Status: DC
Start: 2018-07-28 — End: 2018-09-08

## 2018-07-28 NOTE — Progress Notes (Signed)
[facility]  [facility]  [address] [place] [address]  Dept: [phone]  Dept Fax: [phone]  Loc: [phone]    Tanya Velasquez is a 53 y.o. female who presents today for her medical conditions/complaints as noted below.  Tanya Velasquez is c/o of Urinary Tract Infection (pt has had uti symptoms for the last 4 days and feel urgency and burning. )        HPI:     Chief Complaint   Patient presents with   ??? Urinary Tract Infection     pt has had uti symptoms for the last 4 days and feel urgency and burning.        Urinary Tract Infection    This is a new problem. The current episode started in the past 7 days. The problem occurs every urination. The problem has been gradually worsening. The quality of the pain is described as aching (Suprapubic). The pain is at a severity of 5/10. The pain is moderate. There has been no fever. The fever has been present for less than 1 day. She is sexually active. There is no history of pyelonephritis. Associated symptoms include frequency, hesitancy and urgency. Pertinent negatives include no chills, discharge, flank pain, hematuria, nausea, possible pregnancy, sweats or vomiting. She has tried increased fluids for the symptoms. The treatment provided mild relief. There is no history of catheterization, kidney stones, recurrent UTIs, a single kidney, urinary stasis or a urological procedure.     She has not stopped smoking yet.  She is considering stopping smoking soon.  She has not started her Chantix yet.      Past Medical History:   Diagnosis Date   ??? Chronic back pain    ??? Chronic hip pain 2009   ??? Depression    ??? Essential hypertension 12/05/2017   ??? Mixed hyperlipidemia 07/08/2018   ??? Moderate aortic regurgitation 07/08/2018   ??? Osteoarthritis    ??? Substance abuse ([place])     Tobacco abuse    ??? Tobacco abuse 01/30/2018      Past Surgical History:   Procedure Laterality Date   ??? BACK SURGERY  05/2017    pt has an intrathecal pain pump  implanted in her back.    ??? ELBOW SURGERY Left     Childhood    ??? HYSTERECTOMY, VAGINAL     ??? TOTAL HIP ARTHROPLASTY Right 2009       Family History   Problem Relation Age of Onset   ??? Breast Cancer Mother    ??? Heart Attack Father    ??? High Blood Pressure Father    ??? High Cholesterol Father    ??? Breast Cancer Sister        Social History     Tobacco Use   ??? Smoking status: Current Every Day Smoker     Packs/day: 0.50     Years: 23.00     Pack years: 11.50     Types: Cigarettes     Start date: 05/14/1994   ??? Smokeless tobacco: Never Used   Substance Use Topics   ??? Alcohol use: Yes     Alcohol/week: 12.0 standard drinks     Types: 12 Cans of beer per week     Comment: occ         Current Outpatient Medications   Medication Sig Dispense Refill   ??? sulfamethoxazole-trimethoprim (BACTRIM DS;SEPTRA DS) 800-160 MG per tablet Take 1 tablet  by mouth 2 times daily for 7 days 14 tablet 0   ??? escitalopram (LEXAPRO) 20 MG tablet TAKE 1 TABLET BY MOUTH EVERY DAY 30 tablet 5   ??? varenicline (CHANTIX STARTING MONTH PAK) 0.5 MG X 11 & 1 MG X 42 tablet Take by mouth. 1 box 0   ??? atenolol (TENORMIN) 25 MG tablet Take 1 tablet by mouth daily 30 tablet 3   ??? diclofenac (VOLTAREN) 75 MG EC tablet TAKE 1 TABLET BY MOUTH TWICE A DAY 60 tablet 5   ??? atorvastatin (LIPITOR) 20 MG tablet Take 1 tablet by mouth daily 30 tablet 5   ??? sodium chloride 0.9 % SOLN with HYDROmorphone HCl PF 50 MG/5ML SOLN 0.2 mg/mL Infuse 0.5 mg/hr intravenously continuous. Indications: pt has pain pump     ??? valACYclovir (VALTREX) 1 g tablet Take 2 tablets in the AM and PM once at the onset of a cold sore 30 tablet 1   ??? cyclobenzaprine (FLEXERIL) 10 MG tablet   2     No current facility-administered medications for this visit.      Allergies   Allergen Reactions   ??? Gabapentin      Other reaction(s): Headaches       Subjective:      Review of Systems   Constitutional: Negative.  Negative for chills.   Respiratory: Negative.    Cardiovascular: Negative.     Gastrointestinal: Negative.  Negative for nausea and vomiting.   Genitourinary: Positive for frequency, hesitancy and urgency. Negative for decreased urine volume, difficulty urinating, dyspareunia, dysuria, enuresis, flank pain, genital sores, hematuria, menstrual problem, pelvic pain, vaginal bleeding, vaginal discharge and vaginal pain.   Musculoskeletal: Negative.    Skin: Negative.    Neurological: Negative.    Psychiatric/Behavioral: Negative.          Objective:     Vitals:    07/28/18 1403   BP: 124/88   Site: Right Upper Arm   Position: Sitting   Cuff Size: Medium Adult   Pulse: 109   Temp: 98.8 ??F (37.1 ??C)   SpO2: 98%   Weight: 186 lb (84.4 kg)   Height: '5\' 6"'  (1.676 m)     Wt Readings from Last 3 Encounters:   07/28/18 186 lb (84.4 kg)   07/08/18 184 lb (83.5 kg)   06/26/18 182 lb 15.7 oz (83 kg)     Temp Readings from Last 3 Encounters:   07/28/18 98.8 ??F (37.1 ??C)   04/15/18 99.4 ??F (37.4 ??C) (Oral)   07/22/15 97.9 ??F (36.6 ??C) (Oral)     BP Readings from Last 3 Encounters:   07/28/18 124/88   07/08/18 (!) 124/90   05/08/18 132/80     Pulse Readings from Last 3 Encounters:   07/28/18 109   07/08/18 101   05/08/18 90     Physical Exam  Vitals signs and nursing note reviewed.   Constitutional:       General: She is not in acute distress.     Appearance: Normal appearance. She is well-developed. She is obese. She is not diaphoretic.   HENT:      Head: Normocephalic and atraumatic.      Right Ear: External ear normal.      Left Ear: External ear normal.      Nose: Nose normal.   Eyes:      General:         Right eye: No discharge.  Left eye: No discharge.      Conjunctiva/sclera: Conjunctivae normal.   Neck:      Musculoskeletal: Normal range of motion and neck supple. No neck rigidity.   Cardiovascular:      Rate and Rhythm: Normal rate.   Pulmonary:      Effort: Pulmonary effort is normal.   Abdominal:      General: Bowel sounds are normal.      Palpations: Abdomen is soft. There is no shifting  dullness, fluid wave, hepatomegaly, splenomegaly, mass or pulsatile mass.      Tenderness: There is abdominal tenderness in the suprapubic area. There is no right CVA tenderness, left CVA tenderness, guarding or rebound.   Musculoskeletal: Normal range of motion.         General: No deformity.   Skin:     General: Skin is warm and dry.      Coloration: Skin is not jaundiced or pale.      Findings: No bruising, erythema, lesion or rash.   Neurological:      Mental Status: She is alert and oriented to person, place, and time. Mental status is at baseline.   Psychiatric:         Mood and Affect: Mood normal.         Behavior: Behavior normal.         Thought Content: Thought content normal.         Judgment: Judgment normal.         Office Visit on 07/08/2018   Component Date Value Ref Range Status   ??? Sodium 07/08/2018 140  136 - 145 mmol/L Final   ??? Potassium 07/08/2018 4.6  3.5 - 5.1 mmol/L Final   ??? Chloride 07/08/2018 99  99 - 110 mmol/L Final   ??? CO2 07/08/2018 25  21 - 32 mmol/L Final   ??? Anion Gap 07/08/2018 16  3 - 16 Final   ??? Glucose 07/08/2018 86  70 - 99 mg/dL Final   ??? BUN 07/08/2018 14  7 - 20 mg/dL Final   ??? CREATININE 07/08/2018 0.8  0.6 - 1.1 mg/dL Final   ??? GFR Non-African American 07/08/2018 >60  >60 Final    Comment: >60 mL/min/1.38m EGFR, calc. for ages 190and older using the  MDRD formula (not corrected for weight), is valid for stable  renal function.     ??? GFR African American 07/08/2018 >60  >60 Final    Comment: Chronic Kidney Disease: less than 60 ml/min/1.73 sq.m.          Kidney Failure: less than 15 ml/min/1.73 sq.m.  Results valid for patients 18 years and older.     ??? Calcium 07/08/2018 10.1  8.3 - 10.6 mg/dL Final   ??? Total Protein 07/08/2018 7.6  6.4 - 8.2 g/dL Final   ??? Alb 07/08/2018 4.8  3.4 - 5.0 g/dL Final   ??? Albumin/Globulin Ratio 07/08/2018 1.7  1.1 - 2.2 Final   ??? Total Bilirubin 07/08/2018 0.3  0.0 - 1.0 mg/dL Final   ??? Alkaline Phosphatase 07/08/2018 64  40 - 129 U/L Final    ??? ALT 07/08/2018 30  10 - 40 U/L Final   ??? AST 07/08/2018 24  15 - 37 U/L Final   ??? Globulin 07/08/2018 2.8  g/dL Final   ??? Total CK 07/08/2018 177  26 - 192 U/L Final   ??? Cholesterol, Total 07/08/2018 188  0 - 199 mg/dL Final   ??? Triglycerides 07/08/2018 127  0 - 150 mg/dL Final   ??? HDL 07/08/2018 92* 40 - 60 mg/dL Final   ??? LDL Calculated 07/08/2018 71  <100 mg/dL Final   ??? VLDL Cholesterol Calculated 07/08/2018 25  Not Established mg/dL Final           Assessment & Plan:     The following diagnoses and conditions are stable with no further orders unless indicated:  1. Acute UTI    2. Urinary urgency    3. Tobacco abuse        Tanya Velasquez was seen today for urinary tract infection.    Bactrim prescribed for acute UTI.  Urine culture sent.  Recommend increased fluids, rest, and limiting caffeine.      Diagnoses and all orders for this visit:    Acute UTI  -     sulfamethoxazole-trimethoprim (BACTRIM DS;SEPTRA DS) 800-160 MG per tablet; Take 1 tablet by mouth 2 times daily for 7 days    Tobacco abuse    Urinary urgency      Prior to Visit Medications    Medication Sig Taking? Authorizing Provider   sulfamethoxazole-trimethoprim (BACTRIM DS;SEPTRA DS) 800-160 MG per tablet Take 1 tablet by mouth 2 times daily for 7 days Yes Tarri Fuller, APRN - CNP   escitalopram (LEXAPRO) 20 MG tablet TAKE 1 TABLET BY MOUTH EVERY DAY Yes Tarri Fuller, APRN - CNP   varenicline (CHANTIX STARTING MONTH PAK) 0.5 MG X 11 & 1 MG X 42 tablet Take by mouth. Yes Tarri Fuller, APRN - CNP   atenolol (TENORMIN) 25 MG tablet Take 1 tablet by mouth daily Yes Tarri Fuller, APRN - CNP   diclofenac (VOLTAREN) 75 MG EC tablet TAKE 1 TABLET BY MOUTH TWICE A DAY Yes Tarri Fuller, APRN - CNP   atorvastatin (LIPITOR) 20 MG tablet Take 1 tablet by mouth daily Yes Tarri Fuller, APRN - CNP   sodium chloride 0.9 % SOLN with HYDROmorphone HCl PF 50 MG/5ML SOLN 0.2 mg/mL Infuse 0.5 mg/hr intravenously continuous.  Indications: pt has pain pump Yes Historical Provider, MD   valACYclovir (VALTREX) 1 g tablet Take 2 tablets in the AM and PM once at the onset of a cold sore Yes Tarri Fuller, APRN - CNP   cyclobenzaprine (FLEXERIL) 10 MG tablet  Yes Historical Provider, MD        Orders Placed This Encounter   Medications   ??? sulfamethoxazole-trimethoprim (BACTRIM DS;SEPTRA DS) 800-160 MG per tablet     Sig: Take 1 tablet by mouth 2 times daily for 7 days     Dispense:  14 tablet     Refill:  0         Return if symptoms worsen or fail to improve.    Patient should call the office immediately with new or ongoing signs or symptoms or worsening, or proceedto the emergency room.  No changes in past medical history, past surgical history, social history, or family history were noted during the patient encounter unless specifically listed above.  All updates of past medicalhistory, past surgical history, social history, or family history were reviewed personally by me during the office visit.  All problems listed in the assessment are stable unless noted otherwise.  Medication profilereviewed personally by me during the office visit.  Medication side effects and possible impairments from medications were discussed as applicable.  ??  Call if pattern of symptoms change or persists for an extended time.    This document  was prepared by a combination of typing and transcription through a voice recognition software.    Tobacco abuse: Patient was counseled about stopping tobacco use. Discussed harmful effects of continued tobacco use including COPD, cardiovascular disease, and/or death. Discussed the increased risk of pneumonia as well as the potential for substantial decline in lung function. The following recommendations were given to improve chances of quitting:  ??  1)                   Chances of stopping improve with each attempt  ??  2)                   Encourage all other occupants in the house to quit  ??  3)                    Remove all tobacco products from home, work, and vehicles  ??  4)                   Tobacco cessation aids such as nicotine replacement therapy options    It is very important that she quit smoking. There are various alternatives available to help with this difficult task, but first and foremost, she must make a firm commitment and decision to quit. The nature of nicotine addiction is discussed. The usefulness of behavioral therapy is discussed and suggested.  The correct use, cost and side effects of nicotine replacement therapy such as gum or patches is discussed. Bupropion and its cost (sometimes not covered fully by insurance) and side effects are reviewed. The quit rates are discussed. I recommend she not allow potential costs of treatment to deter her from using nicotine replacement therapy or bupropion, as the long term economic and health benefits are obvious.

## 2018-07-28 NOTE — Telephone Encounter (Signed)
Spoke w/ patient. Only has urinary symptoms. No fever, no URI c/o. No known exposure to anyone positive for Covid19.

## 2018-07-28 NOTE — Patient Instructions (Addendum)
Patient Education        Urinary Tract Infection in Women: Care Instructions  Your Care Instructions    A urinary tract infection, or UTI, is a general term for an infection anywhere between the kidneys and the urethra (where urine comes out). Most UTIs are bladder infections. They often cause pain or burning when you urinate.  UTIs are caused by bacteria and can be cured with antibiotics. Be sure to complete your treatment so that the infection goes away.  Follow-up care is a key part of your treatment and safety. Be sure to make and go to all appointments, and call your doctor if you are having problems. It's also a good idea to know your test results and keep a list of the medicines you take.  How can you care for yourself at home?  ?? Take your antibiotics as directed. Do not stop taking them just because you feel better. You need to take the full course of antibiotics.  ?? Drink extra water and other fluids for the next day or two. This may help wash out the bacteria that are causing the infection. (If you have kidney, heart, or liver disease and have to limit fluids, talk with your doctor before you increase your fluid intake.)  ?? Avoid drinks that are carbonated or have caffeine. They can irritate the bladder.  ?? Urinate often. Try to empty your bladder each time.  ?? To relieve pain, take a hot bath or lay a heating pad set on low over your lower belly or genital area. Never go to sleep with a heating pad in place.  To prevent UTIs  ?? Drink plenty of water each day. This helps you urinate often, which clears bacteria from your system. (If you have kidney, heart, or liver disease and have to limit fluids, talk with your doctor before you increase your fluid intake.)  ?? Urinate when you need to.  ?? Urinate right after you have sex.  ?? Change sanitary pads often.  ?? Avoid douches, bubble baths, feminine hygiene sprays, and other feminine hygiene products that have deodorants.  ?? After going to the bathroom, wipe  from front to back.  When should you call for help?  Call your doctor now or seek immediate medical care if:  ?? ?? Symptoms such as fever, chills, nausea, or vomiting get worse or appear for the first time.   ?? ?? You have new pain in your back just below your rib cage. This is called flank pain.   ?? ?? There is new blood or pus in your urine.   ?? ?? You have any problems with your antibiotic medicine.   ??Watch closely for changes in your health, and be sure to contact your doctor if:  ?? ?? You are not getting better after taking an antibiotic for 2 days.   ?? ?? Your symptoms go away but then come back.   Where can you learn more?  Go to [email or web address] and sign in to your MyChart account. Enter K848 in the Search Health Information box to learn more about "Urinary Tract Infection in Women: Care Instructions."     If you do not have an account, please click on the "Sign Up Now" link.  Current as of: August 21, 2019Content Version: 12.4  ?? 2006-2020 [employer].  Care instructions adapted under license by [facility]. If you have questions about a medical condition or this instruction, always ask your healthcare professional. [employer] disclaims   any warranty or liability for your use of this information.       Patient Education        Interstitial Cystitis: Care Instructions  Your Care Instructions    Interstitial cystitis is a long-term irritation of the bladder. It can cause mild to severe pain that comes and goes. You also may feel a sudden urge to urinate or need to urinate often. Sometimes the walls of the bladder become scarred or get stiff.  Doctors do not know what causes interstitial cystitis. But they do know that it is not caused by an infection. The problem is much more common in women than in men. Your doctor may do tests to make sure that you do not have an infection, kidney stones, or bladder cancer.  Because the cause of interstitial cystitis is not  known, your doctor may try several treatments. It may take several weeks or months to find a treatment that works. If diet and lifestyle changes do not help, you may need medicine. Your doctor may also put liquid or medicine into your bladder for a short time to treat the pain.  Follow-up care is a key part of your treatment and safety. Be sure to make and go to all appointments, and call your doctor if you are having problems. It's also a good idea to know your test results and keep a list of the medicines you take.  How can you care for yourself at home?  ?? Take your medicines exactly as prescribed. Call your doctor if you think you are having a problem with your medicine.  ?? If your doctor prescribed antibiotics, take them as directed. Do not stop taking them just because you feel better. You need to take the full course of antibiotics.  ?? Avoid eating spicy foods or high-acid foods, such as tomatoes and oranges, if these foods seem to make your pain worse. Also, limit caffeine and alcohol.  ?? If a certain food seems to cause pain in your bladder, stop eating it to see if the pain goes away.  ?? Do not smoke. Smoking can irritate the bladder and cause bladder cancer. If you need help quitting, talk to your doctor about stop-smoking programs and medicines. These can increase your chances of quitting for good.  ?? Try bladder training. Set certain times to go to the bathroom and slowly increase the time between visits. This may help lengthen the time your bladder can hold urine.  ?? You might try a treatment called TENS. It sends a very mild electric current through wires placed near the pubic area. This is done for at least several minutes 2 times each day.  ?? Consider a support group. Sharing your experiences with other people who have the same problem may help you learn more and cope better.  ?? Wash your pubic area with a mild soap. Avoid deodorant soaps or soaps with heavy perfumes.  ?? Wear loose-fitting clothing  that does not put pressure on your bladder.  When should you call for help?  Call your doctor now or seek immediate medical care if:  ?? ?? You have symptoms of a urinary infection. For example:  ? You have blood or pus in your urine.  ? You have pain in your back just below your rib cage. This is called flank pain.  ? You have a fever, chills, or body aches.  ? It hurts to urinate.  ? You have groin or belly pain.   ??Watch  closely for changes in your health, and be sure to contact your doctor if:  ?? ?? You do not get better as expected.   Where can you learn more?  Go to [email or web address] and sign in to your MyChart account. Enter [phone] in the Search Health Information box to learn more about "Interstitial Cystitis: Care Instructions."     If you do not have an account, please click on the "Sign Up Now" link.  Current as of: August 21, 2019Content Version: 12.4  ?? 2006-2020 [employer].  Care instructions adapted under license by [facility]. If you have questions about a medical condition or this instruction, always ask your healthcare professional. [employer] disclaims any warranty or liability for your use of this information.

## 2018-07-28 NOTE — Telephone Encounter (Signed)
Patient believes she has a uti and wants something called in to [facility]. States pain to urinate and urgency started Thursday  . Their contact number is [phone]

## 2018-07-29 NOTE — Other (Signed)
Physical Therapy  Cancellation/No-show Note    Patient Name:  Tanya Velasquez  DOB:  08-22-1965   Date:  07/29/2018  Cancels to Date: 0  No-shows to Date: 1    For today's appointment patient:  []   Cancelled  []   Rescheduled appointment  [x]   No-show     Reason given by patient:  []   Patient ill  []   Conflicting appointment  []   No transportation    []   Conflict with work  []   No reason given  []   Other:     Comments:      Electronically signed by:  Henri Medal, PT, [id number]

## 2018-07-30 ENCOUNTER — Inpatient Hospital Stay: Payer: BLUE CROSS/BLUE SHIELD

## 2018-07-30 LAB — CULTURE, URINE: Urine Culture, Routine: 25000

## 2018-07-31 ENCOUNTER — Inpatient Hospital Stay: Admit: 2018-07-31 | Payer: BLUE CROSS/BLUE SHIELD

## 2018-07-31 NOTE — Progress Notes (Signed)
Outpatient Physical Therapy     '[x]'  Daily Treatment Note   '[]'  Progress Note   '[x]'  Discharge Note  Patient has made significant progress throughout the physical therapy plan of care and PT sessions. Patient has 12% neck disability index at the time of discharge. Also patient met 4/4 short term goals. And 3/4 long term goals. Patient had been provided with home exercise program for 4 weeks with progression. Patient is discharged today as a precaution for coronavirus outbreak.     Date:  07/31/2018    Patient Name:  Tanya Velasquez         Date of Birth:   Mar 15, 1966    Medical Diagnosis:   Cervical strain, Strain of neck muscle, initial encounter                                         ICD 10:  S16.1XXA  ??  Treatment Diagnosis: Cervicothoracic dysfunction and hypomobility                                   ??  Onset Date:    Dec 3rd, 2019     Referral Date:  06/26/2018      ??  Referring Physician: Lavona Mound, PA                                                    Visits Allowed/Insurance/Certification Information: Anthem/ 1-2 per week for 4-6 weeks/ 20 visits per year  ??  Restrictions/Precautions: None    Plan of care sent to provider:      '[]' Faxed  '[x]' Co-signature    (attempts: 1'[x]'  2 '[]' 3'[]' )         Plan of care signed:      '[x]' Yes date: 07/04/2018    '[]' No      Progress Note covers period from (if applicable):    '[x]' NA    '[]' From          To           Next Progress Note due:   By 07/31/2018     Visit# / total visits:  5/8    Plan for Next Session:      Subjective:    Pt had not chance to do exercise program since she had been on vacation.   Patient reported decreased to no pain during painful activities such as driving  Pt still complains of LB pain today, but improving, looking up and no R sided lateral cervical pain with R upper trapezius trigger points.   Patient also reported decrease in headache frequency and pain levels.       Pain level: upper thoracic region, pain currently 4/10. No headaches today. Neck  pain at its worst since last visit as 4/10 since last visit. No low back pain today.  AT EVAL: Patient describes pain to be dull aching with headaches not specific for positions   Patient reports pain is  4/10 pain at present and  6/10 pain at its worst.  Worsened by looking to right and looking up.    Improved by lie down, pain pump in the back. Ibuprofen  Objective:     Exercises:    Exercises in bold performed in department today.  Items not bolded are carried forward from prior visits for continuity of the record.  Exercise/Equipment Resistance/Repetitions HEP Other comments     Chin tuck in supine 10 reps x 2 sets '[x]'       Cervical neck ROM in supine and sitting  10 reps x 2 sets in all available planes '[x]'       Scapular retraction in prone 10 reps x  2 sets '[x]'       Thoracic expansion with flexion and extension with breathing control 10 reps x 2 sets '[x]'       Wall angels in supine and In supported standing against the wall 15 reps x 2 sets '[x]'       Thoracic spine stretch with pillow or towel roll under the chest while lying prone 5 min '[x]'       Chin tucks in prone 10 reps x 2sets '[x]'     Upper trapezius stretching in sitting and supine 30 sec hold x 3 reps x B/L '[x]'       Prone T hold with 1# B/L 10 sec hold x 10 reps '[x]'       Prone Y hold  With 1# B/L 10 sec hold x 10 reps '[x]'        Standing Row pulls from front and overhead position with red band 15 reps in each position x 1 set  '[]'       Thoracic spine extension with arms across the chest from head down position  10 reps x 2sets '[]'         '[]'         '[]'         '[]'         '[]'         '[]'         '[]'       Therapeutic Exercise/Home Exercise Program: 40 minutes  Patient had been given following exercise program with access codes.  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  Patient was able to demonstrated all the following exercises without any problems or  difficulties and without any adverse effects. Patient was also shown how to access the exercise program with videos using provider web address.   Access Code: [email or web address]   URL: [email or web address]   Date: 07/31/2018   Prepared by: Julienne Kass     Exercises   Seated Cervical Flexion AROM - 10 reps - 2 sets - 1x daily - 7x weekly   Seated Cervical Extension AROM - 10 reps - 2 sets - 1x daily - 7x weekly   Standing Cervical Rotation AROM - 10 reps - 2 sets - 1x daily - 7x weekly   Standing Cervical Sidebending AROM - 10 reps - 2 sets - 1x daily - 7x weekly   Seated Thoracic Lumbar Extension with Pectoralis Stretch - 10 reps - 2 sets - 1x daily - 7x weekly   Seated Thoracic Flexion and Extension - 10 reps - 2 sets - 1x daily - 7x weekly   Seated Shoulder Shrug Circles AROM Forward - 10 reps - 2 sets - 1x daily - 7x weekly   Seated Scapular Retraction - 10 reps - 2 sets - 1x daily - 7x weekly     Part II: following exercises are given as a part of home exercises program for  the period of 4 weeks with progression included in the packet using theraband, using 500 ml, 1 liter bottle as a resistance at home. Patient was explained to perform these exercises in consistent manner for each week in order to progress them to new resistance level. Patient was able to demonstrated all the following exercises without any problems or difficulties and without any adverse effects. Patient was also shown how to access the exercise program with videos using provider web address.    Access Code: [email or web address]   URL: [email or web address]   Date: 07/31/2018   Prepared by: Julienne Kass     Exercises   Supine Cervical Retraction with Towel - 10 reps - 2 sets - 2x daily - 7x weekly   Prone Cervical Retraction - 10 reps - 2 sets - 2x daily - 7x weekly   Prone Scapular Retraction Arms at Side - 10 reps - 3 sets - 3 hold - 2x daily - 7x weekly (1st wk: without weights, 2nd wk with 500 ml bottle, 3rd week 24 oz bottle and 4rth wk with 1  Liter bottle)   Prone Scapular Retraction Y - 10 reps - 3 sets - 3 hold - 2x daily - 7x weekly (1st wk: without weights, 2nd wk with 500 ml bottle, 3rd week 24 oz bottle and 4rth wk with 1 Liter bottle)  Quadruped Thoracic Spine Extension - 10 reps - 2 sets - 2x daily - 7x weekly   Cat-Camel - 10 reps - 2 sets - 2x daily - 7x weekly   Wall Angels - 10 reps - 3 sets - 2x daily - 7x weekly   Standing Shoulder Row with Anchored Resistance - 10 reps - 3 sets - 1x daily - 7x weekly (1st wk: without red theraband, 2nd wk with green theraband, 3rd week green theraband and 4rth wk with if possible with blue theraband)    Group Therapy:    0 minutes    Therapeutic Activity:  0 minutes     Gait: 0 minutes    Neuromuscular Re-Education:  0 minutes      Canalith Repositioning Procedure:  0 minutes    Manual Therapy: 15 minutes  Patient was given cervicothoracic and upper thoracic distraction to improve sagittal plane of ROM of cervical spine. Audible cavitations with improved sagittal plane of cervical ROM from 110 degrees to 130 degrees.   Followed by manual traction in neutral cervical position in supine for 30 sec hold for 3 times, followed by 30 sec hold with cervical flexion in 20 degrees flexion.   Cervical flexion 65 degrees --> range improved to 70 degrees  Cervical extension 45 degrees --> range improved to 60 degrees  Cervical side bending and rotations are WNL    Modalities: 0 minutes    Functional Outcome Measure:   '[]' NA  Measure Used: Neck disability index  Date Assessed: 07/31/2018  Score: 6/50, 12% disability    Assessment/Treatment/Activity Tolerance:     Patient???s response to treatment: No complaints, no pain after.  '[x]' Patient tolerated treatment well '[]' Patient limited by fatigue   '[]' Patient limited by pain  '[]' Patient limited by other medical complications   '[]' Other:     Goals:   Progress towards goals:  Goals established on 07/03/18   Short Term Goals:  2   weeks Long Term Goals:   4  weeks   1).  Establish  HEP GOAL MET 1).  Pt independent with HEP GOAL MET   2).  Pain  4/10 or less  GOAL MET 2).  Pain 2/10 or less   3).  Patient will demonstrate improved cervical ROM by 10 degrees GOAL MET 3). Patient will have normal cervical ROM GOAL MET   4). Patient will present with lower scores of neck disability index to 35% GOAL MET 4).  Patient will present with lower scores of neck disability index to 30% GOAL MET   5). 5).   6). 6).       Prognosis: '[x]' Good   '[]' Fair   '[]' Poor    Patient Requires Follow-up:  '[x]' Yes  '[]' No    Plan: '[]' Plan of care initiated     '[]' Continue per plan of care    '[]'  Alter current plan (see comments)    '[]' Hold pending MD visit '[x]' Discharge    Timed Code Treatment Minutes:  55    Total Treatment Minutes: 55    Medicare Cap total YTD:   '[x]' N/A    Workers Comp Time Stamp  '[x]' N/A   Time In:   Time Out:    Electronically signed by:  Julienne Kass, [id number]

## 2018-09-08 ENCOUNTER — Telehealth: Admit: 2018-09-08 | Discharge: 2018-09-08 | Payer: BLUE CROSS/BLUE SHIELD | Attending: Registered Nurse

## 2018-09-08 ENCOUNTER — Encounter

## 2018-09-08 DIAGNOSIS — R399 Unspecified symptoms and signs involving the genitourinary system: Secondary | ICD-10-CM

## 2018-09-08 LAB — POCT URINALYSIS DIPSTICK
Bilirubin, UA: NEGATIVE
Blood, UA POC: NEGATIVE
Glucose, UA POC: NEGATIVE
Ketones, UA: NEGATIVE
Nitrite, UA: NEGATIVE
Protein, UA POC: NEGATIVE
Spec Grav, UA: 1.02
Urobilinogen, UA: 0.2
pH, UA: 6.5

## 2018-09-08 MED ORDER — ESTROGENS, CONJUGATED 0.625 MG/GM VA CREA
0.625 | VAGINAL | 0 refills | Status: DC
Start: 2018-09-08 — End: 2019-03-31

## 2018-09-08 MED ORDER — SULFAMETHOXAZOLE-TRIMETHOPRIM 800-160 MG PO TABS
800-160 MG | ORAL_TABLET | Freq: Two times a day (BID) | ORAL | 0 refills | Status: AC
Start: 2018-09-08 — End: 2018-09-13

## 2018-09-08 NOTE — Progress Notes (Signed)
09/08/2018    TELEHEALTH EVALUATION -- Audio/Visual (During COVID-19 public health emergency)    HPI:    Tanya Velasquez (DOB:  03-23-1966) has requested an audio/video evaluation for the following concern(s):    Urinary Tract Infection    This is a new problem. The current episode started in the past 7 days. The problem occurs every urination. The problem has been gradually worsening. The quality of the pain is described as burning. The pain is at a severity of 3/10. The pain is mild. There has been no fever. The fever has been present for less than 1 day. She is sexually active. There is no history of pyelonephritis. Associated symptoms include frequency, hesitancy and urgency. Pertinent negatives include no chills, discharge, flank pain, hematuria, nausea, possible pregnancy, sweats or vomiting. She has tried increased fluids for the symptoms. The treatment provided no relief. Her past medical history is significant for recurrent UTIs. There is no history of catheterization, kidney stones, a single kidney, urinary stasis or a urological procedure.     Review of Systems   Constitutional: Negative.  Negative for chills.   Respiratory: Negative.    Cardiovascular: Negative.    Gastrointestinal: Negative for abdominal pain, diarrhea, nausea and vomiting.   Genitourinary: Positive for dysuria, frequency, hesitancy and urgency. Negative for decreased urine volume, difficulty urinating, dyspareunia, enuresis, flank pain, genital sores, hematuria, menstrual problem, pelvic pain, vaginal bleeding, vaginal discharge and vaginal pain.   Skin: Negative.    Neurological: Negative.    Psychiatric/Behavioral: Negative.        Prior to Visit Medications    Medication Sig Taking? Authorizing Provider   conjugated estrogens (PREMARIN) 0.625 MG/GM vaginal cream Apply a pea size amount to urethra daily. Yes [name], APRN - CNP   sulfamethoxazole-trimethoprim (BACTRIM DS;SEPTRA DS) 800-160 MG per tablet Take 1 tablet by  mouth 2 times daily for 5 days Yes [name], APRN - CNP   escitalopram (LEXAPRO) 20 MG tablet TAKE 1 TABLET BY MOUTH EVERY DAY Yes [name], APRN - CNP   varenicline (CHANTIX STARTING MONTH PAK) 0.5 MG X 11 & 1 MG X 42 tablet Take by mouth. Yes [name], APRN - CNP   atenolol (TENORMIN) 25 MG tablet Take 1 tablet by mouth daily Yes [name], APRN - CNP   diclofenac (VOLTAREN) 75 MG EC tablet TAKE 1 TABLET BY MOUTH TWICE A DAY Yes [name], APRN - CNP   atorvastatin (LIPITOR) 20 MG tablet Take 1 tablet by mouth daily Yes [name], APRN - CNP   sodium chloride 0.9 % SOLN with HYDROmorphone HCl PF 50 MG/5ML SOLN 0.2 mg/mL Infuse 0.5 mg/hr intravenously continuous. Indications: pt has pain pump Yes Historical Provider, MD   valACYclovir (VALTREX) 1 g tablet Take 2 tablets in the AM and PM once at the onset of a cold sore Yes [name], APRN - CNP   cyclobenzaprine (FLEXERIL) 10 MG tablet  Yes Historical Provider, MD       Social History     Tobacco Use   ??? Smoking status: Current Every Day Smoker     Packs/day: 0.50     Years: 23.00     Pack years: 11.50     Types: Cigarettes     Start date: 05/14/1994   ??? Smokeless tobacco: Never Used   Substance Use Topics   ??? Alcohol use: Yes     Alcohol/week: 12.0 standard drinks  Types: 12 Cans of beer per week     Comment: occ   ??? Drug use: Never        Allergies   Allergen Reactions   ??? Gabapentin      Other reaction(s): Headaches   ,   Past Medical History:   Diagnosis Date   ??? Chronic back pain    ??? Chronic hip pain 2009   ??? Depression    ??? Essential hypertension 12/05/2017   ??? Mixed hyperlipidemia 07/08/2018   ??? Moderate aortic regurgitation 07/08/2018   ??? Osteoarthritis    ??? Substance abuse (HCC)     Tobacco abuse    ??? Tobacco abuse 01/30/2018   ,   Past Surgical History:   Procedure Laterality Date   ??? BACK SURGERY  05/2017    pt has an intrathecal pain pump implanted in her back.    ??? ELBOW  SURGERY Left     Childhood    ??? HYSTERECTOMY, VAGINAL     ??? TOTAL HIP ARTHROPLASTY Right 2009   ,   Social History     Tobacco Use   ??? Smoking status: Current Every Day Smoker     Packs/day: 0.50     Years: 23.00     Pack years: 11.50     Types: Cigarettes     Start date: 05/14/1994   ??? Smokeless tobacco: Never Used   Substance Use Topics   ??? Alcohol use: Yes     Alcohol/week: 12.0 standard drinks     Types: 12 Cans of beer per week     Comment: occ   ??? Drug use: Never   ,   Family History   Problem Relation Age of Onset   ??? Breast Cancer Mother    ??? Heart Attack Father    ??? High Blood Pressure Father    ??? High Cholesterol Father    ??? Breast Cancer Sister    ,   Immunization History   Administered Date(s) Administered   ??? Tdap (Boostrix, Adacel) 12/05/2017   ,   Health Maintenance   Topic Date Due   ??? Colon cancer screen colonoscopy  10/24/2016   ??? Flu vaccine (Season Ended) 07/09/2019 (Originally 01/13/2019)   ??? Shingles Vaccine (1 of 2) 07/09/2019 (Originally 10/24/2016)   ??? Pneumococcal 0-64 years Vaccine (1 of 1 - PPSV23) 06/27/2022 (Originally 10/24/1972)   ??? Breast cancer screen  01/08/2019   ??? Lipid screen  07/09/2019   ??? Potassium monitoring  07/09/2019   ??? Creatinine monitoring  07/09/2019   ??? DTaP/Tdap/Td vaccine (2 - Td) 12/06/2027   ??? HIV screen  Completed   ??? Hepatitis A vaccine  Aged Out   ??? Hepatitis B vaccine  Aged Out   ??? Hib vaccine  Aged Out   ??? Meningococcal (ACWY) vaccine  Aged Out       PHYSICAL EXAMINATION:  [ INSTRUCTIONS:  "[x] " Indicates a positive item  "[] " Indicates a negative item     Constitutional: [x]  Appears well-developed and well-nourished [x]  No apparent distress      []  Abnormal-   Mental status  [x]  Alert and awake  [x]  Oriented to person/place/time [x] Able to follow commands      Eyes:  EOM    [x]   Normal  []  Abnormal-  Sclera  [x]   Normal  []  Abnormal -         Discharge [x]   None visible  []  Abnormal -    HENT:   [  x] Normocephalic, atraumatic.  []  Abnormal   [x]  Mouth/Throat:  Mucous membranes are moist.     External Ears [x]  Normal  []  Abnormal-     Neck: [x]  No visualized mass     Pulmonary/Chest: [x]  Respiratory effort normal.  [x]  No visualized signs of difficulty breathing or respiratory distress        []  Abnormal-      Musculoskeletal:   [x]  Normal gait with no signs of ataxia         [x]  Normal range of motion of neck        []  Abnormal-       Neurological:        [x]  No Facial Asymmetry (Cranial nerve 7 motor function) (limited exam to video visit)          [x]  No gaze palsy        []  Abnormal-         Skin:        [x]  No significant exanthematous lesions or discoloration noted on facial skin         []  Abnormal-            Psychiatric:       [x]  Normal Affect [x]  No Hallucinations        []  Abnormal-     Other pertinent observable physical exam findings-  Obese     ASSESSMENT/PLAN:    UA does not show obvious infection.  Will go ahead and treat with Bactrim until urine culture comes back.  Educated her on bladder irritants such as caffeine.  She may try some cranberry tablets or cranberry juice.  Premarin cream sent into pharmacy where she can place a pea size amount around urethra to prevent recurrent infections.  She verbalizes understanding that estrogen can sometimes increase risk for breast cancer.  Educated her on role of urology.  She will consider urology referral in the future in the Premarin cream does not seem to be preventing recurrent UTIs.     1. UTI symptoms  - POCT Urinalysis no Micro  - Culture, Urine  - sulfamethoxazole-trimethoprim (BACTRIM DS;SEPTRA DS) 800-160 MG per tablet; Take 1 tablet by mouth 2 times daily for 5 days  Dispense: 10 tablet; Refill: 0    2. Recurrent UTI  - conjugated estrogens (PREMARIN) 0.625 MG/GM vaginal cream; Apply a pea size amount to urethra daily.  Dispense: 1 Tube; Refill: 0    4. Acute UTI  - sulfamethoxazole-trimethoprim (BACTRIM DS;SEPTRA DS) 800-160 MG per tablet; Take 1 tablet by mouth 2 times daily for 5 days  Dispense: 10  tablet; Refill: 0      Return if symptoms worsen or fail to improve.    Tanya Velasquez is a 53 y.o. female being evaluated by a Virtual Visit (video visit) encounter to address concerns as mentioned above.  A caregiver was present when appropriate. Due to this being a Scientist, research (medical) (During COVID-19 public health emergency), evaluation of the following organ systems was limited: Vitals/Constitutional/EENT/Resp/CV/GI/GU/MS/Neuro/Skin/Heme-Lymph-Imm.  Pursuant to the emergency declaration under the [employer] [facility] Act and the [employer], 1135 waiver authority and the [employer] and [employer] Act, this Virtual Visit was conducted with patient's (and/or legal guardian's) consent, to reduce the patient's risk of exposure to COVID-19 and provide necessary medical care.  The patient (and/or legal guardian) has also been advised to contact this office for worsening conditions or problems, and seek emergency medical treatment and/or call 911 if deemed necessary.  Patient identification was verified at the start of the visit: Yes    Services were provided through a video synchronous discussion virtually to substitute for in-person clinic visit. Patient and provider were located at their individual homes.    --[name], APRN - CNP on 09/08/2018 at 2:05 PM    An electronic signature was used to authenticate this note.

## 2018-09-08 NOTE — Patient Instructions (Signed)
Patient Education        cranberry  Pronunciation:  KRAN ber ee  Brand:  AZO Cranberry Gummies, Azo-Cranberry, Cranberry, Ellura, TheraCran HP  What is the most important information I should know about cranberry?  Follow all directions on the product label and package. Tell each of your healthcare providers about all your medical conditions, allergies, and all medicines you use.  What is cranberry?  Cranberry is produced from the berry fruit of a [place] evergreen shrub. Cranberry is acidic and can interfere with unwanted bacteria in the urinary tract. Cranberry is also believed to act as a diuretic ("water pill").  Cranberry (as juice or in capsules) has been used in alternative medicine as a possibly effective aid in preventing symptoms such as pain or burning with urination. Cranberry will not treat the bacteria that causes a bladder infection.  Other uses not proven with research have included: urination problems caused by an enlarged prostate; reducing urine odor to improve quality of life in people with urinary incontinence; or healing the skin around the opening of a urostomy (a surgical opening formed to direct urine away from the bladder).  It is not certain whether cranberry is effective in treating any medical condition. Medicinal use of this product has not been approved by the FDA. Cranberry should not be used in place of medication prescribed for you by your doctor.  Cranberry is often sold as an herbal supplement. There are no regulated manufacturing standards in place for many herbal compounds and some marketed supplements have been found to be contaminated with toxic metals or other drugs. Herbal/health supplements should be purchased from a reliable source to minimize the risk of contamination.  Cranberry may also be used for purposes not listed in this product guide.  What should I discuss with my healthcare provider before taking cranberry?  You should not use this product if you are  allergic to cranberry.  Ask a doctor, pharmacist, or other healthcare provider if it is safe for you to use this product if you have:  ?? a history of kidney stones;  ?? cirrhosis or other liver disease (some cranberry products may contain alcohol);  ?? diabetes (some cranberry products may contain high amounts of sugar);  ?? a stomach disorder; or  ?? if you are allergic to aspirin.  It is not known whether cranberry will harm an unborn baby.  Do not use this product without medical advice if you are pregnant.  Cranberry may pass into breast milk and may harm a nursing baby. Do not use this product without medical advice if you are breast-feeding a baby.  How should I take cranberry?  When considering the use of herbal supplements, seek the advice of your doctor. You may also consider consulting a practitioner who is trained in the use of herbal/health supplements.  If you choose to use cranberry, use it as directed on the package or as directed by your doctor, pharmacist, or other healthcare provider. Do not use more of this product than is recommended on the label.  Drink plenty of liquids while you are taking cranberry.  The chewable tablet must be chewed before you swallow it.  Do not use different forms (juice, tablets, capsules, etc) of cranberry at the same time without medical advice. Using different formulations together increases the risk of an overdose.  Call your doctor if the condition you are treating with cranberry does not improve, or if it gets worse while using this product.  Store   cranberry in a sealed container as directed on the product label, away from heat and light.  What happens if I miss a dose?  Skip the missed dose if it is almost time for your next scheduled dose. Do not use extra cranberry to make up the missed dose.  What happens if I overdose?  Seek emergency medical attention or call the Poison Help line at 1-800-222-1222.  What should I avoid while taking cranberry?  Avoid drinking more  than 1 liter (34 ounces) of cranberry juice daily over a long period of time. You could develop kidney stones with long-term use of cranberry juice in large amounts.  What are the possible side effects of cranberry?  Get emergency medical help if you have any of these signs of an allergic reaction:  hives; difficult breathing; swelling of your face, lips, tongue, or throat.  Stop using cranberry and call your healthcare provider at once if you have:  ?? continued pain or burning when you urinate;  ?? vomiting, severe stomach pain; or  ?? signs of a kidney stone --painful or difficult urination, pink or red urine, nausea, vomiting, and waves of sharp pain in your side or back spreading to your lower stomach and groin.  Common side effects may include:  ?? upset stomach;  ?? nausea, vomiting; or  ?? diarrhea.  This is not a complete list of side effects and others may occur. Call your doctor for medical advice about side effects. You may report side effects to FDA at 1-800-FDA-1088.  What other drugs will affect cranberry?  Do not take cranberry without medical advice if you are taking warfarin (Coumadin, Jantoven).  Other drugs may interact with cranberry, including prescription and over-the-counter medicines, vitamins, and herbal products. Tell each of your health care providers about all medicines you use now and any medicine you start or stop using.  Where can I get more information?  Consult with a licensed healthcare professional before using any herbal/health supplement. Whether you are treated by a medical doctor or a practitioner trained in the use of natural medicines/supplements, make sure all your healthcare providers know about all of your medical conditions and treatments.  Remember, keep this and all other medicines out of the reach of children, never share your medicines with others, and use this medication only for the indication prescribed.  Every effort has been made to ensure that the information  provided by [employer] ('[employer]') is accurate, up-to-date, and complete, but no guarantee is made to that effect. Drug information contained herein may be time sensitive. [employer] information has been compiled for use by healthcare practitioners and consumers in the [place] and therefore [employer] does not warrant that uses outside of the [place] are appropriate, unless specifically indicated otherwise. [employer]'s drug information does not endorse drugs, diagnose patients or recommend therapy. [employer]'s drug information is an informational resource designed to assist licensed healthcare practitioners in caring for their patients and/or to serve consumers viewing this service as a supplement to, and not a substitute for, the expertise, skill, knowledge and judgment of healthcare practitioners. The absence of a warning for a given drug or drug combination in no way should be construed to indicate that the drug or drug combination is safe, effective or appropriate for any given patient. [employer] does not assume any responsibility for any aspect of healthcare administered with the aid of information [employer] provides. The information contained herein is not intended to cover all possible uses, directions, precautions, warnings,   drug interactions, allergic reactions, or adverse effects. If you have questions about the drugs you are taking, check with your doctor, nurse or pharmacist.  Copyright [id number] [employer] Version: 2.02. Revision date: 01/22/2014.  Care instructions adapted under license by [facility]. If you have questions about a medical condition or this instruction, always ask your healthcare professional. [employer] disclaims any warranty or liability for your use of this information.

## 2018-09-08 NOTE — Progress Notes (Signed)
Tanya Velasquez is a 53 y.o. female evaluated via telephone on 09/08/2018.      Consent:  She and/or health care decision maker is aware that that she may receive a bill for this telephone service, depending on her insurance coverage, and has provided verbal consent to proceed: Yes      Documentation:  I communicated with the patient and/or health care decision maker about UTI.   Details of this discussion including any medical advice provided: n/a      I affirm this is a Patient Initiated Episode with an Established Patient who has not had a related appointment within my department in the past 7 days or scheduled within the next 24 hours.    Total Time: minutes: 5-10 minutes    Note: not billable if this call serves to triage the patient into an appointment for the relevant concern      Wells Guiles

## 2018-09-08 NOTE — Telephone Encounter (Signed)
She may do an Evisit and drop off UA

## 2018-09-08 NOTE — Telephone Encounter (Signed)
Spoke w/ patient. Appt scheduled, pt will drop of UA prior to appt. Orders placed

## 2018-09-08 NOTE — Telephone Encounter (Signed)
Do you want her to drop of a UA  and then do a VV if able?

## 2018-09-08 NOTE — Telephone Encounter (Signed)
Pt was seen on 03/16 for a UTI.  She is having Urgency and wanting to know if medication for her symptoms be called into her pharmacy?    [facility] [address] [place] - [address] [place] - P [phone] - F [phone]

## 2018-09-09 LAB — CULTURE, URINE: Urine Culture, Routine: NO GROWTH

## 2018-10-28 ENCOUNTER — Encounter

## 2018-10-28 ENCOUNTER — Inpatient Hospital Stay: Payer: BLUE CROSS/BLUE SHIELD

## 2018-10-28 ENCOUNTER — Inpatient Hospital Stay: Admit: 2018-10-28 | Payer: BLUE CROSS/BLUE SHIELD

## 2018-10-28 DIAGNOSIS — M5414 Radiculopathy, thoracic region: Secondary | ICD-10-CM

## 2018-11-03 ENCOUNTER — Encounter

## 2018-11-03 MED ORDER — ATENOLOL 25 MG PO TABS
25 MG | ORAL_TABLET | ORAL | 1 refills | Status: DC
Start: 2018-11-03 — End: 2019-06-18

## 2018-11-03 MED ORDER — ATORVASTATIN CALCIUM 20 MG PO TABS
20 MG | ORAL_TABLET | ORAL | 1 refills | Status: DC
Start: 2018-11-03 — End: 2019-04-30

## 2018-11-03 NOTE — Telephone Encounter (Signed)
Refill Request atenolol and atorvastatin    Last Seen: 07/28/2018    Last Written: 07/08/2018 atenolol 30 tablets with 3 refills    05/27/2018 atorvastatin 30 tablets with 5 refills      Next Appointment:   Future Appointments   Date Time Provider Department Center   11/11/2018  7:00 AM [name], APRN - CNP [facility], [facility], [facility]             Requested Prescriptions     Pending Prescriptions Disp Refills   ??? atenolol (TENORMIN) 25 MG tablet [Pharmacy Med Name: ATENOLOL 25 MG TABLET] 90 tablet 1     Sig: TAKE 1 TABLET BY MOUTH EVERY DAY   ??? atorvastatin (LIPITOR) 20 MG tablet [Pharmacy Med Name: ATORVASTATIN 20 MG TABLET] 90 tablet 1     Sig: TAKE 1 TABLET BY MOUTH EVERY DAY

## 2018-11-11 ENCOUNTER — Encounter: Attending: Registered Nurse

## 2018-11-25 ENCOUNTER — Encounter

## 2018-11-25 MED ORDER — VALACYCLOVIR HCL 1 G PO TABS
1 g | ORAL_TABLET | ORAL | 1 refills | Status: DC
Start: 2018-11-25 — End: 2018-12-19

## 2018-11-25 NOTE — Telephone Encounter (Signed)
07/08/18 CC and several acutes since.

## 2018-12-19 ENCOUNTER — Encounter

## 2018-12-19 MED ORDER — VALACYCLOVIR HCL 1 G PO TABS
1 g | ORAL_TABLET | ORAL | 1 refills | Status: DC
Start: 2018-12-19 — End: 2020-02-03

## 2018-12-19 NOTE — Telephone Encounter (Signed)
09/08/18 UTI and 07/28/18 for urinary problem

## 2019-01-27 ENCOUNTER — Ambulatory Visit: Admit: 2019-01-27 | Discharge: 2019-01-27 | Payer: BLUE CROSS/BLUE SHIELD | Attending: Registered Nurse

## 2019-01-27 DIAGNOSIS — I10 Essential (primary) hypertension: Secondary | ICD-10-CM

## 2019-01-27 MED ORDER — ESCITALOPRAM OXALATE 20 MG PO TABS
20 MG | ORAL_TABLET | ORAL | 2 refills | Status: DC
Start: 2019-01-27 — End: 2019-01-28

## 2019-01-27 NOTE — Patient Instructions (Signed)
Patient Education        Learning About Colonoscopy  What is a colonoscopy?     A colonoscopy is a test (also called a procedure) that lets a doctor look inside your large intestine. The doctor uses a thin, lighted tube called a colonoscope. The doctor uses it to look for small growths called polyps, colon or rectal cancer (colorectal cancer), or other problems like bleeding.  During the procedure, the doctor can take samples of tissue. The samples can then be checked for cancer or other conditions. The doctor can also take out polyps.  How is a colonoscopy done?  This procedure is done in a doctor's office or a clinic or hospital. You will get medicine to help you relax and not feel pain. Some people find that they don't remember having the test because of the medicine.  The doctor gently moves the colonoscope, or scope, through the colon. The scope is also a small video camera. It lets the doctor see the colon and take pictures.  How do you prepare for the procedure?  You need to clean out your colon before the procedure so the doctor can see all of your colon. This process may start a day or two before the test. This depends on which "colon prep" your doctor recommends.  To clean your colon, you stop eating solid foods and drink only clear liquids. You can have water, tea, coffee, clear juices, clear broths, flavored ice pops, and gelatin (such as Jell-O). Do not drink anything red or purple.  The day or night before the procedure, you drink a large amount of a special liquid. This causes loose, frequent stools. You will go to the bathroom a lot. It's very important to drink all of the liquid. If you have problems drinking it, call your doctor.  Some people don't go to work or do their usual activities on the day of the prep.  Arrange to have someone take you home after the test.  What can you expect after a colonoscopy?  Your doctor will tell you when you can eat and do your usual activities.  Drink a lot of  fluid after the test to replace the fluids you may have lost during the colon prep. But don't drink alcohol.  Your doctor will talk to you about when you'll need your next colonoscopy. The results of your test and your risk for colorectal cancer will help your doctor decide how often you need to be checked.  After the test, you may be bloated or have gas pains. You may need to pass gas. If a biopsy was done or a polyp was removed, you may have streaks of blood in your stool (feces) for a few days. If polyps were taken out, your doctor may tell you to avoid taking aspirin and nonsteroidal anti-inflammatory drugs (NSAIDs) for 7 to 14 days.  Problems such as heavy rectal bleeding may not occur until several weeks after the test. This isn't common. But it can happen after polyps are removed.  Follow-up care is a key part of your treatment and safety. Be sure to make and go to all appointments, and call your doctor if you are having problems. It's also a good idea to know your test results and keep a list of the medicines you take.  Where can you learn more?  Go to [email or web address] and sign in to your MyChart account. Enter Z368 in the Search Health Information box to learn more about "Learning About   Colonoscopy."     If you do not have an account, please click on the "Sign Up Now" link.  Current as of: January 02, 2018??????????????????????????????Content Version: 12.5  ?? 2006-2020 [employer].   Care instructions adapted under license by [facility]. If you have questions about a medical condition or this instruction, always ask your healthcare professional. [employer] disclaims any warranty or liability for your use of this information.

## 2019-01-27 NOTE — Progress Notes (Signed)
[facility]  [facility]  [address]  [place] [address]  Dept: [phone]  Dept Fax: [phone]  Loc: [phone]    Tanya Velasquez is a 53 y.o. female who presents today for her medical conditions/complaints as noted below.  TALISHA ERBY is c/o of Hypertension (pt does not check bp at home. pt does take medication every day. pt does not notice any cp, sob, edema, and headaches.); Hyperlipidemia (pt is fasting today and takes medication every day. ); and Anxiety (pt feels the anxiety is pretty well managed at this time.)        HPI:     Chief Complaint   Patient presents with   ??? Hypertension     pt does not check bp at home. pt does take medication every day. pt does not notice any cp, sob, edema, and headaches.   ??? Hyperlipidemia     pt is fasting today and takes medication every day.    ??? Anxiety     pt feels the anxiety is pretty well managed at this time.       HPI    Patient is here today to follow up on hypertension.  Taking medications as prescribed. Is trying to adhere to a no salt diet.  Denies any chest pain, leg swelling, orthopnea, dizziness.  She is smoking about half a pack a day and her New Year's Resolution is to stop smoking.     Patient is here for hyperlipidemia follow up.  Taking medication as prescribed.  Denies any side effects to the medication.  Trying to adhere to a low cholesterol diet.  Denies any generalized myalgias.     Patient is here to discuss anxiety/depression.  Doing well on medication.  Denies any side effects.  Would like to continue with medication.  Denies any thought of hurting oneself or others around them.     She admits to her chronic pain improving. She has pain in her neck and back, but she has been going to the gym and working out and feels it is helping her back pain.  She still has lower lumbar back pain and tightness.  Her husband massages her, which helps some.     Past Medical History:   Diagnosis Date   ??? Chronic  back pain    ??? Chronic hip pain 2009   ??? Depression    ??? Essential hypertension 12/05/2017   ??? Mixed hyperlipidemia 07/08/2018   ??? Moderate aortic regurgitation 07/08/2018   ??? Osteoarthritis    ??? Substance abuse (HCC)     Tobacco abuse    ??? Tobacco abuse 01/30/2018      Past Surgical History:   Procedure Laterality Date   ??? BACK SURGERY  05/2017    pt has an intrathecal pain pump implanted in her back.    ??? ELBOW SURGERY Left     Childhood    ??? HYSTERECTOMY, VAGINAL     ??? TOTAL HIP ARTHROPLASTY Right 2009       Family History   Problem Relation Age of Onset   ??? Breast Cancer Mother    ??? Heart Attack Father    ??? High Blood Pressure Father    ??? High Cholesterol Father    ??? Breast Cancer Sister        Social History     Tobacco Use   ??? Smoking status: Current Every Day Smoker     Packs/day: 0.50  Years: 23.00     Pack years: 11.50     Types: Cigarettes     Start date: 05/14/1994   ??? Smokeless tobacco: Never Used   Substance Use Topics   ??? Alcohol use: Yes     Alcohol/week: 12.0 standard drinks     Types: 12 Cans of beer per week     Comment: occ         Current Outpatient Medications   Medication Sig Dispense Refill   ??? escitalopram (LEXAPRO) 20 MG tablet TAKE 1 TABLET BY MOUTH EVERY DAY 90 tablet 2   ??? valACYclovir (VALTREX) 1 g tablet TAKE 2 TABLETS IN THE AM AND PM ONCE AT THE ONSET OF A COLD SORE 30 tablet 1   ??? atenolol (TENORMIN) 25 MG tablet TAKE 1 TABLET BY MOUTH EVERY DAY 90 tablet 1   ??? atorvastatin (LIPITOR) 20 MG tablet TAKE 1 TABLET BY MOUTH EVERY DAY 90 tablet 1   ??? conjugated estrogens (PREMARIN) 0.625 MG/GM vaginal cream Apply a pea size amount to urethra daily. 1 Tube 0   ??? sodium chloride 0.9 % SOLN with HYDROmorphone HCl PF 50 MG/5ML SOLN 0.2 mg/mL Infuse 0.5 mg/hr intravenously continuous. Indications: pt has pain pump     ??? cyclobenzaprine (FLEXERIL) 10 MG tablet   2     No current facility-administered medications for this visit.      Allergies   Allergen Reactions   ??? Gabapentin      Other  reaction(s): Headaches       Subjective:      Review of Systems   Constitutional: Negative.  Negative for activity change, appetite change, chills, diaphoresis, fatigue, fever and unexpected weight change.   HENT: Negative.  Negative for ear pain, rhinorrhea, sinus pressure, sneezing, sore throat and trouble swallowing.    Eyes: Negative for photophobia, pain, discharge, redness, itching and visual disturbance.   Respiratory: Negative.  Negative for apnea, cough, choking, chest tightness, shortness of breath, wheezing and stridor.    Cardiovascular: Negative for chest pain, palpitations and leg swelling.   Gastrointestinal: Negative.  Negative for abdominal pain, blood in stool, constipation, diarrhea, nausea and vomiting.   Genitourinary: Negative.  Negative for decreased urine volume, difficulty urinating, dysuria, enuresis, flank pain, frequency, genital sores, hematuria and urgency.   Musculoskeletal: Negative.  Negative for arthralgias, back pain, gait problem, joint swelling, myalgias, neck pain and neck stiffness.   Skin: Negative.  Negative for color change, pallor, rash and wound.   Allergic/Immunologic: Negative.    Neurological: Negative.  Negative for dizziness, facial asymmetry, weakness, light-headedness and headaches.   Psychiatric/Behavioral: Negative for agitation, behavioral problems, confusion, decreased concentration, dysphoric mood, hallucinations, self-injury, sleep disturbance and suicidal ideas. The patient is not nervous/anxious and is not hyperactive.          Objective:     Vitals:    01/27/19 0755   BP: 100/68   Site: Right Upper Arm   Position: Sitting   Cuff Size: Medium Adult   Pulse: 74   Temp: 97.1 ??F (36.2 ??C)   SpO2: 97%   Weight: 190 lb (86.2 kg)   Height: 5\' 6"  (1.676 m)     Wt Readings from Last 3 Encounters:   01/27/19 190 lb (86.2 kg)   07/28/18 186 lb (84.4 kg)   07/08/18 184 lb (83.5 kg)     Temp Readings from Last 3 Encounters:   01/27/19 97.1 ??F (36.2 ??C)   07/28/18 98.8  ??F (37.1 ??C)  04/15/18 99.4 ??F (37.4 ??C) (Oral)     BP Readings from Last 3 Encounters:   01/27/19 100/68   07/28/18 124/88   07/08/18 (!) 124/90     Pulse Readings from Last 3 Encounters:   01/27/19 74   07/28/18 109   07/08/18 101     Physical Exam  Vitals signs and nursing note reviewed.   Constitutional:       General: She is not in acute distress.     Appearance: Normal appearance. She is well-developed. She is obese. She is not diaphoretic.   HENT:      Head: Normocephalic and atraumatic.      Right Ear: Tympanic membrane, ear canal and external ear normal. There is no impacted cerumen.      Left Ear: Tympanic membrane, ear canal and external ear normal. There is no impacted cerumen.      Nose: Nose normal. No congestion or rhinorrhea.      Mouth/Throat:      Mouth: Mucous membranes are moist.      Pharynx: Oropharynx is clear. No oropharyngeal exudate or posterior oropharyngeal erythema.   Eyes:      General: No scleral icterus.        Right eye: No discharge.         Left eye: No discharge.      Extraocular Movements: Extraocular movements intact.      Conjunctiva/sclera: Conjunctivae normal.      Pupils: Pupils are equal, round, and reactive to light.   Neck:      Musculoskeletal: Normal range of motion and neck supple. No neck rigidity or muscular tenderness.      Vascular: No carotid bruit.      Trachea: No tracheal deviation.   Cardiovascular:      Rate and Rhythm: Normal rate and regular rhythm.      Pulses: Normal pulses.      Heart sounds: Normal heart sounds. No murmur. No friction rub. No gallop.    Pulmonary:      Effort: Pulmonary effort is normal. No respiratory distress.      Breath sounds: Normal breath sounds. No stridor. No wheezing, rhonchi or rales.   Chest:      Chest wall: No tenderness.   Abdominal:      General: Bowel sounds are normal. There is no distension.      Palpations: Abdomen is soft. There is no mass.      Tenderness: There is no abdominal tenderness. There is no guarding or  rebound.      Hernia: No hernia is present.   Musculoskeletal: Normal range of motion.         General: No swelling, tenderness, deformity or signs of injury.      Right lower leg: No edema.      Left lower leg: No edema.   Lymphadenopathy:      Cervical: No cervical adenopathy.   Skin:     General: Skin is warm and dry.      Capillary Refill: Capillary refill takes less than 2 seconds.      Coloration: Skin is not jaundiced or pale.      Findings: No bruising, erythema, lesion or rash.   Neurological:      General: No focal deficit present.      Mental Status: She is alert and oriented to person, place, and time. Mental status is at baseline.      Cranial Nerves: No cranial nerve deficit.  Sensory: No sensory deficit.      Motor: No weakness or abnormal muscle tone.      Coordination: Coordination normal.      Gait: Gait normal.      Deep Tendon Reflexes: Reflexes are normal and symmetric. Reflexes normal.   Psychiatric:         Mood and Affect: Mood normal.         Behavior: Behavior normal.         Thought Content: Thought content normal.         Judgment: Judgment normal.         Virtual Visit on 09/08/2018   Component Date Value Ref Range Status   ??? Glucose, UA POC 09/08/2018 neg   Final   ??? Bilirubin, UA 09/08/2018 neg   Final   ??? Ketones, UA 09/08/2018 neg   Final   ??? Spec Grav, UA 09/08/2018 1.020   Final   ??? Blood, UA POC 09/08/2018 neg   Final   ??? pH, UA 09/08/2018 6.5   Final   ??? Protein, UA POC 09/08/2018 neg   Final   ??? Urobilinogen, UA 09/08/2018 0.2   Final   ??? Leukocytes, UA 09/08/2018 trace   Final   ??? Nitrite, UA 09/08/2018 neg   Final   ??? Urine Culture, Routine 09/08/2018 No growth at 18-36 hours   Final           Assessment & Plan:     The following diagnoses and conditions are stable with no further orders unless indicated:  1. Essential hypertension    2. Mixed hyperlipidemia    3. Reactive depression (situational)    4. Muscle tightness    5. Tobacco abuse    6. Screening for breast cancer     7. Screening for colon cancer      Tanya Velasquez was seen today for hypertension, hyperlipidemia and anxiety.    Depression and anxiety stable.  No changes to medication at this time.  She would like to stay on the current dose she has at Lexapro 20 mg daily.  She will consider decreasing the Lexapro after the New Year.     She is doing well on her statin.  BP at goal.  Recommend smoking cessation, which she is going to try at the beginning of the New Year.      Massage Therapy referral given.      Diagnoses and all orders for this visit:    Essential hypertension    Mixed hyperlipidemia    Reactive depression (situational)  -     escitalopram (LEXAPRO) 20 MG tablet; TAKE 1 TABLET BY MOUTH EVERY DAY    Muscle tightness  -     External Referral To Massage Therapy    Tobacco abuse    Screening for breast cancer  -     [facility] DIGITAL SCREEN BILATERAL; Future    Screening for colon cancer  -     [place] - Loura Pardon, MD, Gastroenterology, [place]    Prior to Visit Medications    Medication Sig Taking? Authorizing Provider   escitalopram (LEXAPRO) 20 MG tablet TAKE 1 TABLET BY MOUTH EVERY DAY Yes Tarri Fuller, APRN - CNP   valACYclovir (VALTREX) 1 g tablet TAKE 2 TABLETS IN THE AM AND PM ONCE AT THE ONSET OF A COLD SORE Yes Denice Bors, MD   atenolol (TENORMIN) 25 MG tablet TAKE 1 TABLET BY MOUTH EVERY DAY Yes Tarri Fuller, APRN -  CNP   atorvastatin (LIPITOR) 20 MG tablet TAKE 1 TABLET BY MOUTH EVERY DAY Yes Dierdre Harnessatherine D Saydie Gerdts, APRN - CNP   conjugated estrogens (PREMARIN) 0.625 MG/GM vaginal cream Apply a pea size amount to urethra daily. Yes Dierdre Harnessatherine D Geraldy Akridge, APRN - CNP   sodium chloride 0.9 % SOLN with HYDROmorphone HCl PF 50 MG/5ML SOLN 0.2 mg/mL Infuse 0.5 mg/hr intravenously continuous. Indications: pt has pain pump Yes Historical Provider, MD   cyclobenzaprine (FLEXERIL) 10 MG tablet  Yes Historical Provider, MD        Orders Placed This Encounter   Medications   ???  escitalopram (LEXAPRO) 20 MG tablet     Sig: TAKE 1 TABLET BY MOUTH EVERY DAY     Dispense:  90 tablet     Refill:  2         Return in about 6 months (around 07/27/2019) for Annual physical.    Patient should call the office immediately with new or ongoing signs or symptoms or worsening, or proceedto the emergency room.  No changes in past medical history, past surgical history, social history, or family history were noted during the patient encounter unless specifically listed above.  All updates of past medicalhistory, past surgical history, social history, or family history were reviewed personally by me during the office visit.  All problems listed in the assessment are stable unless noted otherwise.  Medication profilereviewed personally by me during the office visit.  Medication side effects and possible impairments from medications were discussed as applicable.  ??  Call if pattern of symptoms change or persists for an extended time.    This document was prepared by a combination of typing and transcription through a voice recognition software.    Tobacco abuse: Patient was counseled about stopping tobacco use. Discussed harmful effects of continued tobacco use including COPD, cardiovascular disease, and/or death. Discussed the increased risk of pneumonia as well as the potential for substantial decline in lung function. The following recommendations were given to improve chances of quitting:  ??  1)                   Chances of stopping improve with each attempt  ??  2)                   Encourage all other occupants in the house to quit  ??  3)                   Remove all tobacco products from home, work, and vehicles  ??  4)                   Tobacco cessation aids such as nicotine replacement therapy options    It is very important that she quit smoking. There are various alternatives available to help with this difficult task, but first and foremost, she must make a firm commitment and decision to quit. The  nature of nicotine addiction is discussed. The usefulness of behavioral therapy is discussed and suggested.  The correct use, cost and side effects of nicotine replacement therapy such as gum or patches is discussed. Bupropion and its cost (sometimes not covered fully by insurance) and side effects are reviewed. The quit rates are discussed. I recommend she not allow potential costs of treatment to deter her from using nicotine replacement therapy or bupropion, as the long term economic and health benefits are obvious.

## 2019-01-28 ENCOUNTER — Encounter

## 2019-01-28 MED ORDER — ESCITALOPRAM OXALATE 20 MG PO TABS
20 MG | ORAL_TABLET | ORAL | 1 refills | Status: DC
Start: 2019-01-28 — End: 2019-08-10

## 2019-01-28 NOTE — Telephone Encounter (Signed)
Last OV 01/27/19  Future Appointments   Date Time Provider Department Center   06/30/2019  7:00 AM [name], APRN - CNP [facility]

## 2019-03-25 NOTE — Telephone Encounter (Signed)
-----   Message from Lindajo Royal sent at 03/25/2019  1:59 PM EST -----  Subject: Appointment Request    Reason for Call: Routine Pre-Op    QUESTIONS  Type of Appointment? Established Patient  Reason for appointment request? Available appointments did not meet   patient need  Additional Information for Provider? Patient is having pain pump put in on   11/23 not sure where the surgery is yet but knows its on the 23rd. No EGK   needed.  ---------------------------------------------------------------------------  --------------  Rod Can INFO  What is the best way for the office to contact you? OK to leave message on   voicemail  Preferred Call Back Phone Number? [phone]  ---------------------------------------------------------------------------  --------------  SCRIPT ANSWERS  Relationship to Patient? Self  Appointment reason? Symptomatic  Select script based on patient symptoms? Adult Pre-Op  Do you have question for your provider that need to be answered prior to   scheduling your pre-op appointment? No  Have you been diagnosed with   tested for   or told that you are suspected of having COVID-19 (Coronavirus)? No  Have you had a fever or taken medication to treat a fever within the past   3 days? No  Have you had a cough   shortness of breath or flu-like symptoms within the past 3 days? No  Do you currently have flu-like symptoms including fever or chills   cough   shortness of breath   or difficulty breathing   or new loss of taste or smell? No  (Service Expert ??? click yes below to proceed with [employer] As Usual   Scheduling)? Yes

## 2019-03-25 NOTE — Telephone Encounter (Signed)
1120 Tuesday?

## 2019-03-25 NOTE — Telephone Encounter (Signed)
Appt scheduled

## 2019-03-31 ENCOUNTER — Ambulatory Visit: Admit: 2019-03-31 | Discharge: 2019-03-31 | Attending: Registered Nurse

## 2019-03-31 DIAGNOSIS — Z01818 Encounter for other preprocedural examination: Secondary | ICD-10-CM

## 2019-03-31 NOTE — Patient Instructions (Addendum)
Take Atenolol and Lexapro on morning of surgery with sip of water, and hold all other medications until after surgery. Stop NSAIDS (Motrin, Aleve, Ibuprofen), vitamin E, aspirin, fish oil 7-10 days prior to surgery unless instructed otherwise by surgeon.    Patient Education     Learning About How to Prepare for Surgery  How can you prepare before surgery?     You can do some things that will help you safely prepare for surgery.  ?? Understand exactly what surgery is planned.   You should know the risks, benefits, and other options.  ?? Tell your doctors ALL the medicines, vitamins, supplements, and herbal remedies you take.   Some of these can increase the risk of bleeding. Or they may interact with anesthesia.  ?? Follow your doctor's instructions about which medicines to take or stop before your surgery.  ? You may need to stop taking some medicines a week or more before surgery.  ? If you take aspirin or some other blood thinner, be sure to talk to your doctor.  ?? Follow any other instructions your doctor gave you.  ?? If you have an advance directive, let your doctor know, and bring a copy to the hospital.   It may include a living will and a durable power of attorney for health care. It lets your doctor and loved ones know your health care wishes. If you don't have one, you may want to prepare one.  How can you prepare on the day of surgery?  Here are some tips about what to do at home before you leave for your surgery.  ?? If your doctor told you to take your medicines on the day of surgery, take them with only a sip of water.  ?? Follow the instructions about when to stop eating and drinking.   If you don't, your surgery may be canceled.  ?? Follow your doctor's instructions about when to bathe or shower before your surgery.  ?? Do not shave the surgical site yourself.  ?? Take off all jewelry and piercings.  ?? Take out contact lenses, if you wear them.  ?? Have a picture ID ready to take with you.   Your ID will be  checked before your surgery.  ?? Know when to call your doctor.   Call your doctor if you:  ? Become ill before surgery.  ? Need to reschedule.  ? Have changed your mind about having the surgery.  What happens before surgery?  Here are some things you can expect to happen before your surgery.  ?? Your picture ID will be checked.  ?? The area of your body that needs surgery is often marked to make sure there are no errors.  ?? You will be kept comfortable and safe by your anesthesia provider. The anesthesia may make you sleep. Or it may just numb the area being worked on.  What happens when you are ready to go home?  Be sure you have someone drive you home. Anesthesia and pain medicine make it unsafe for you to drive. You will get instructions about recovering from your surgery. This is called a discharge plan. It will cover things like diet, wound care, follow-up care, driving, and getting back to your normal routine.  Follow-up care is a key part of your treatment and safety. Be sure to make and go to all appointments, and call your doctor if you are having problems. It's also a good idea to know your test  results and keep a list of the medicines you take.  Where can you learn more?  Go to [email or web address] and sign in to your MyChart account. Enter Q270 in the Search Health Information box to learn more about "Learning About How to Prepare for Surgery."     If you do not have an account, please click on the "Sign Up Now" link.  Current as of: Oct 08, 2018??????????????????????????????Content Version: 12.6  ?? 2006-2020 [employer].   Care instructions adapted under license by [facility]. If you have questions about a medical condition or this instruction, always ask your healthcare professional. [employer] disclaims any warranty or liability for your use of this information.

## 2019-03-31 NOTE — Progress Notes (Signed)
[facility]                             Tanya Velasquez                             Preoperative Evaluation        Tanya Velasquez  Date of Birth:  Sep 20, 1965    Date of Service:  03/31/2019    Vitals:    03/31/19 1145 03/31/19 1148 03/31/19 1222   BP: (!) 126/102 (!) 130/100 136/88   Site: Left Upper Arm Left Upper Arm Right Upper Arm   Position: Sitting Sitting Sitting   Cuff Size: Medium Adult Medium Adult Medium Adult   Pulse: 100     Temp: 97.8 ??F (36.6 ??C)     SpO2: 98%     Weight: 188 lb (85.3 kg)     Height: 5\' 6"  (1.676 m)        Wt Readings from Last 2 Encounters:   03/31/19 188 lb (85.3 kg)   01/27/19 190 lb (86.2 kg)     BP Readings from Last 3 Encounters:   03/31/19 136/88   01/27/19 100/68   07/28/18 124/88        Chief Complaint   Patient presents with   ??? Pre-op Exam     pt is here for a pre op for pain pump replacement. Surgery is being done by Dr Cline Crockanko on 04/06/19 at [facility].      Allergies   Allergen Reactions   ??? Gabapentin      Other reaction(s): Headaches     Outpatient Medications Marked as Taking for the 03/31/19 encounter (Office Visit) with Dierdre Harnessatherine D Terran Klinke, APRN - CNP   Medication Sig Dispense Refill   ??? escitalopram (LEXAPRO) 20 MG tablet TAKE 1 TABLET BY MOUTH EVERY DAY 90 tablet 1   ??? valACYclovir (VALTREX) 1 g tablet TAKE 2 TABLETS IN THE AM AND PM ONCE AT THE ONSET OF A COLD SORE 30 tablet 1   ??? atenolol (TENORMIN) 25 MG tablet TAKE 1 TABLET BY MOUTH EVERY DAY 90 tablet 1   ??? atorvastatin (LIPITOR) 20 MG tablet TAKE 1 TABLET BY MOUTH EVERY DAY 90 tablet 1   ??? sodium chloride 0.9 % SOLN with HYDROmorphone HCl PF 50 MG/5ML SOLN 0.2 mg/mL Infuse 0.5 mg/hr intravenously continuous. Indications: pt has pain pump     ??? cyclobenzaprine (FLEXERIL) 10 MG tablet   2       This patient presents to the office today for a preoperative consultation at the request of surgeon, Dr. Cline Crockanko, who plans on performing pain pump replacement on November 23 at  [facility].  The current problem began several months ago, and symptoms have been worsening with time.  Conservative therapy: N/A.    Planned anesthesia: General   Known anesthesia problems: Constipation   Bleeding risk: No recent or remote history of abnormal bleeding  Personal or FH of DVT/PE: No      Patient Active Problem List   Diagnosis   ??? Essential hypertension   ??? Chronic pain syndrome   ??? Pain of right hip joint   ??? Chronic low back pain   ??? Reactive depression (situational)   ??? Family history of breast cancer   ??? Tobacco abuse   ??? Moderate aortic regurgitation   ??? Mixed hyperlipidemia  Past Medical History:   Diagnosis Date   ??? Chronic back pain    ??? Chronic hip pain 2009   ??? Depression    ??? Essential hypertension 12/05/2017   ??? Mixed hyperlipidemia 07/08/2018   ??? Moderate aortic regurgitation 07/08/2018   ??? Obesity    ??? Osteoarthritis    ??? Substance abuse (HCC)     Tobacco abuse    ??? Tobacco abuse 01/30/2018     Past Surgical History:   Procedure Laterality Date   ??? BACK SURGERY  05/2017    pt has an intrathecal pain pump implanted in her back.    ??? ELBOW SURGERY Left     Childhood    ??? HYSTERECTOMY, VAGINAL     ??? TOTAL HIP ARTHROPLASTY Right 2009     Family History   Problem Relation Age of Onset   ??? Breast Cancer Mother    ??? Heart Attack Father    ??? High Blood Pressure Father    ??? High Cholesterol Father    ??? Breast Cancer Sister      Social History     Socioeconomic History   ??? Marital status: Married     Spouse name: Not on file   ??? Number of children: Not on file   ??? Years of education: Not on file   ??? Highest education level: Not on file   Occupational History   ??? Not on file   Social Needs   ??? Financial resource strain: Not on file   ??? Food insecurity     Worry: Not on file     Inability: Not on file   ??? Transportation needs     Medical: Not on file     Non-medical: Not on file   Tobacco Use   ??? Smoking status: Current Every Day Smoker     Packs/day: 0.50     Years: 23.00     Pack years: 11.50      Types: Cigarettes     Start date: 05/14/1994   ??? Smokeless tobacco: Never Used   Substance and Sexual Activity   ??? Alcohol use: Yes     Alcohol/week: 12.0 standard drinks     Types: 12 Cans of beer per week     Comment: occ   ??? Drug use: Never   ??? Sexual activity: Yes     Partners: Male   Lifestyle   ??? Physical activity     Days per week: Not on file     Minutes per session: Not on file   ??? Stress: Not on file   Relationships   ??? Social Wellsite geologist on phone: Not on file     Gets together: Not on file     Attends religious service: Not on file     Active member of club or organization: Not on file     Attends meetings of clubs or organizations: Not on file     Relationship status: Not on file   ??? Intimate partner violence     Fear of current or ex partner: Not on file     Emotionally abused: Not on file     Physically abused: Not on file     Forced sexual activity: Not on file   Other Topics Concern   ??? Not on file   Social History Narrative   ??? Not on file       Review of Systems  A comprehensive review of  systems was negative except for: chronic back pain     Physical Exam   Constitutional: She is oriented to person, place, and time. She appears well-developed and well-nourished. No distress.   HENT:   Head: Normocephalic and atraumatic.   Mouth/Throat: Uvula is midline, oropharynx is clear and moist and mucous membranes are normal.   Eyes: Conjunctivae and EOM are normal. Pupils are equal, round, and reactive to light.   Neck: Trachea normal and normal range of motion. Neck supple. No JVD present. Carotid bruit is not present. No mass and no thyromegaly present.   Cardiovascular: Normal rate, regular rhythm, normal heart sounds and intact distal pulses.  Exam reveals no gallop and no friction rub.  No murmur heard.  Pulmonary/Chest: Effort normal and breath sounds normal. No respiratory distress. She has no wheezes. She has no rales.   Abdominal: Soft. Normal aorta and bowel sounds are normal. She exhibits  no distension and no mass. There is no hepatosplenomegaly. No tenderness.   Musculoskeletal: She exhibits no edema and no tenderness.   Neurological: She is alert and oriented to person, place, and time. She has normal strength. No cranial nerve deficit or sensory deficit. Coordination and gait normal.   Skin: Skin is warm and dry. No rash noted. No erythema.   Psychiatric: She has a normal mood and affect. Her behavior is normal.     EKG Interpretation:  normal EKG, normal sinus rhythm.    Lab Review   No visits with results within 6 Month(s) from this visit.   Latest known visit with results is:   Virtual Visit on 09/08/2018   Component Date Value   ??? Glucose, UA POC 09/08/2018 neg    ??? Bilirubin, UA 09/08/2018 neg    ??? Ketones, UA 09/08/2018 neg    ??? Spec Grav, UA 09/08/2018 1.020    ??? Blood, UA POC 09/08/2018 neg    ??? pH, UA 09/08/2018 6.5    ??? Protein, UA POC 09/08/2018 neg    ??? Urobilinogen, UA 09/08/2018 0.2    ??? Leukocytes, UA 09/08/2018 trace    ??? Nitrite, UA 09/08/2018 neg    ??? Urine Culture, Routine 09/08/2018 No growth at 18-36 hours            Assessment:       53 y.o. patient with planned surgery as above.    Known risk factors for perioperative complications: Hypertension, Tobacco abuse, moderate aortic regurgitation, suspected sleep apnea   Current medications which may produce withdrawal symptoms if withheld perioperatively: Yes, hydromorphone      1. Pre-op exam    2. Essential hypertension    3. Mixed hyperlipidemia    4. Screening for osteoporosis    5. Encounter for vitamin deficiency screening    6. Screening for thyroid disorder         Plan:     1. Preoperative workup as follows: ECG, hemoglobin, hematocrit, electrolytes, creatinine, liver function studies  2. Further recommendations from consultants:No    3. Change in medication regimen before surgery: Take Atenolol and Lexapro on morning of surgery with sip of water, and hold all other medications until after surgery. Stop NSAIDS (Motrin,  Aleve, Ibuprofen), vitamin E, aspirin, fish oil 7-10 days prior to surgery unless instructed otherwise by surgeon.  4. Prophylaxis for cardiac events with perioperative beta-blockers: Currently taking  atenolol  ACC/AHA indications for pre-operative beta-blocker use:    ?? Vascular surgery with history of postitive stress test  ?? Intermediate or  high risk surgery with history of CAD   ?? Intermediate or high risk surgery with multiple clinical predictors of CAD- 2 of the following: history of compensated or prior heart failure, history of cerebrovascular disease, DM, or renal insufficiency    Routine administration of higher-dose, long-acting metoprolol in beta-blocker-na??ve patients on the day of surgery, and in the absence of dose titration is associated with an overall increase in mortality.  Beta-blockers should be started days to weeks prior to surgery and titrated to pulse < 70.  5. Deep vein thrombosis prophylaxis: regimen to be chosen by surgical team  6. No contraindications to planned surgery      If you have questions, please do not hesitate to call me [phone]).     Sincerely,                Kasandra Knudsen, CNP

## 2019-04-01 ENCOUNTER — Encounter

## 2019-04-01 LAB — COMPREHENSIVE METABOLIC PANEL
ALT: 30 U/L (ref 10–40)
AST: 23 U/L (ref 15–37)
Albumin/Globulin Ratio: 1.6 (ref 1.1–2.2)
Albumin: 4.7 g/dL (ref 3.4–5.0)
Alkaline Phosphatase: 86 U/L (ref 40–129)
Anion Gap: 12 (ref 3–16)
BUN: 9 mg/dL (ref 7–20)
CO2: 26 mmol/L (ref 21–32)
Calcium: 10.5 mg/dL (ref 8.3–10.6)
Chloride: 105 mmol/L (ref 99–110)
Creatinine: 0.8 mg/dL (ref 0.6–1.1)
GFR African American: >60 (ref >60)
GFR Non-African American: >60 (ref >60)
Globulin: 3 g/dL
Glucose: 99 mg/dL (ref 70–99)
Potassium: 5 mmol/L (ref 3.5–5.1)
Sodium: 143 mmol/L (ref 136–145)
Total Bilirubin: <0.2 mg/dL (ref 0.0–1.0)
Total Protein: 7.7 g/dL (ref 6.4–8.2)

## 2019-04-01 LAB — CBC WITH AUTO DIFFERENTIAL
Basophils %: 1 %
Basophils Absolute: 0.1 10*3/uL (ref 0.0–0.2)
Eosinophils %: 1.9 %
Eosinophils Absolute: 0.2 10*3/uL (ref 0.0–0.6)
Hematocrit: 42.3 % (ref 36.0–48.0)
Hemoglobin: 14.2 g/dL (ref 12.0–16.0)
Lymphocytes %: 27.3 %
Lymphocytes Absolute: 3 10*3/uL (ref 1.0–5.1)
MCH: 32.7 pg (ref 26.0–34.0)
MCHC: 33.6 g/dL (ref 31.0–36.0)
MCV: 97.2 fL (ref 80.0–100.0)
MPV: 7.5 fL (ref 5.0–10.5)
Monocytes %: 5.8 %
Monocytes Absolute: 0.6 10*3/uL (ref 0.0–1.3)
Neutrophils %: 64 %
Neutrophils Absolute: 7 10*3/uL (ref 1.7–7.7)
Platelets: 465 10*3/uL — ABNORMAL HIGH (ref 135–450)
RBC: 4.36 M/uL (ref 4.00–5.20)
RDW: 13.1 % (ref 12.4–15.4)
WBC: 11 10*3/uL (ref 4.0–11.0)

## 2019-04-01 LAB — LIPID PANEL
Cholesterol, Total: 223 mg/dL — ABNORMAL HIGH (ref 0–199)
HDL: 88 mg/dL — ABNORMAL HIGH (ref 40–60)
LDL Calculated: 96 mg/dL (ref <100)
Triglycerides: 194 mg/dL — ABNORMAL HIGH (ref 0–150)
VLDL Cholesterol Calculated: 39 mg/dL

## 2019-04-01 LAB — VITAMIN B12 & FOLATE
Folate: >20 ng/mL (ref 4.78–24.20)
Vitamin B-12: 501 pg/mL (ref 211–911)

## 2019-04-01 LAB — TSH WITH REFLEX: TSH: 0.97 u[IU]/mL (ref 0.27–4.20)

## 2019-04-01 LAB — VITAMIN D 25 HYDROXY: Vit D, 25-Hydroxy: 27.4 ng/mL — ABNORMAL LOW (ref >=30)

## 2019-04-01 MED ORDER — ERGOCALCIFEROL 1.25 MG (50000 UT) PO CAPS
1.25 MG (50000 UT) | ORAL_CAPSULE | ORAL | 1 refills | Status: AC
Start: 2019-04-01 — End: ?

## 2019-04-30 ENCOUNTER — Encounter

## 2019-04-30 MED ORDER — ATORVASTATIN CALCIUM 20 MG PO TABS
20 MG | ORAL_TABLET | ORAL | 1 refills | Status: DC
Start: 2019-04-30 — End: 2019-08-10

## 2019-04-30 NOTE — Telephone Encounter (Signed)
Upcoming appt 06-30-19

## 2019-06-18 ENCOUNTER — Encounter

## 2019-06-18 MED ORDER — ATENOLOL 25 MG PO TABS
25 MG | ORAL_TABLET | ORAL | 1 refills | Status: DC
Start: 2019-06-18 — End: 2020-09-22

## 2019-06-18 NOTE — Telephone Encounter (Signed)
Upcoming appt 06-30-19

## 2019-06-30 ENCOUNTER — Encounter: Attending: Registered Nurse

## 2019-07-09 ENCOUNTER — Encounter

## 2019-07-09 MED ORDER — PREMARIN 0.625 MG/GM VA CREA
0.625 MG/GM | VAGINAL | 0 refills | Status: AC
Start: 2019-07-09 — End: ?

## 2019-08-03 NOTE — Telephone Encounter (Signed)
She may get vitamin D rechecked; however, if she is taking supplement, she would not need anymore blood work until next year

## 2019-08-03 NOTE — Telephone Encounter (Signed)
Patient is taking vit D supplement attempted to call and inform that she needs no labs. No answer and no VM

## 2019-08-03 NOTE — Telephone Encounter (Signed)
Pt informed

## 2019-08-03 NOTE — Telephone Encounter (Signed)
-----   Message from Lennox Solders sent at 08/03/2019 10:58 AM EDT -----  Subject: Message to Provider    QUESTIONS  Information for Provider? Pt has appt on 3/29 and needs lab orders before   that appt. Please advise   ---------------------------------------------------------------------------  --------------  CALL BACK INFO  What is the best way for the office to contact you? OK to leave message on   voicemail  Preferred Call Back Phone Number? [phone]  ---------------------------------------------------------------------------  --------------  SCRIPT ANSWERS  Relationship to Patient? Self

## 2019-08-03 NOTE — Telephone Encounter (Signed)
CMP, CBC, Lipid, TSH w/reflux, Vit D, B12? Pt did have labs in November. Not sure if all need repeated or any at all? Please advise

## 2019-08-10 ENCOUNTER — Ambulatory Visit: Admit: 2019-08-10 | Discharge: 2019-08-10 | Payer: BLUE CROSS/BLUE SHIELD | Attending: Registered Nurse

## 2019-08-10 DIAGNOSIS — Z780 Asymptomatic menopausal state: Secondary | ICD-10-CM

## 2019-08-10 MED ORDER — VENLAFAXINE HCL ER 75 MG PO CP24
75 MG | ORAL_CAPSULE | Freq: Every day | ORAL | 0 refills | Status: DC
Start: 2019-08-10 — End: 2019-09-01

## 2019-08-10 NOTE — Telephone Encounter (Signed)
SCANNED INTO [facility]

## 2019-08-10 NOTE — Telephone Encounter (Signed)
GAVE [facility] / [employer] ( [facility] ) 04/15/2018 TO 08/10/2019 TO JENNY BRAUN TO SCAN IN [facility] TO [employer]

## 2019-08-10 NOTE — Progress Notes (Signed)
[facility]  [facility]  [address] [address]  Dept: [phone]  Dept Fax: [phone]  Loc: [phone]    Tanya Velasquez is a 54 y.o. female who presents today for her medical conditions/complaints as noted below.  Tanya Velasquez is c/o of Hyperlipidemia (pt is not fasting. pt takes medication every day. ), Hypertension (pt does not check it a home but can feel when it is elevated. pt takesmedicaiton every day.), and Anxiety (pt has been dealing with a lot at home and has been having a lot of night sweats. )        HPI:     Chief Complaint   Patient presents with   ??? Hyperlipidemia     pt is not fasting. pt takes medication every day.    ??? Hypertension     pt does not check it a home but can feel when it is elevated. pt takesmedicaiton every day.   ??? Anxiety     pt has been dealing with a lot at home and has been having a lot of night sweats.        HPI    Patient is here today to follow up on hypertension.  Taking medications as prescribed. Is trying to adhere to a no salt diet.  Denies any chest pain, leg swelling, orthopnea, dizziness.      Patient is here for hyperlipidemia follow up.  Taking medication as prescribed.  Denies any side effects to the medication.  Trying to adhere to a low cholesterol diet.  Denies any generalized myalgias.     Patient is here to discuss anxiety/depression.  Doing well on medication, but admits to having increased night sweats.  Denies any side effects.  Would like to continue with medication or see if there is a medication that might help her more with night sweats since she is perimenopausal.  Denies any thought of hurting oneself or others around them. She admits to having more stress than usual as she is going to be taking care of her parents who have dementia and she may be moving to [place] soon.     Patient is here to follow up on vitamin D deficiency.  Patient is taking vitamin D supplement without any adverse  effects as prescribed.      Past Medical History:   Diagnosis Date   ??? Chronic back pain    ??? Chronic hip pain 2009   ??? Depression    ??? Essential hypertension 12/05/2017   ??? Mixed hyperlipidemia 07/08/2018   ??? Moderate aortic regurgitation 07/08/2018   ??? Obesity    ??? Osteoarthritis    ??? Substance abuse ([place])     Tobacco abuse    ??? Tobacco abuse 01/30/2018      Past Surgical History:   Procedure Laterality Date   ??? BACK SURGERY  05/2017    pt has an intrathecal pain pump implanted in her back.    ??? ELBOW SURGERY Left     Childhood    ??? HYSTERECTOMY, VAGINAL     ??? TOTAL HIP ARTHROPLASTY Right 2009       Family History   Problem Relation Age of Onset   ??? Breast Cancer Mother    ??? Heart Attack Father    ??? High Blood Pressure Father    ??? High Cholesterol Father    ??? Breast Cancer Sister        Social  History     Tobacco Use   ??? Smoking status: Current Every Day Smoker     Packs/day: 0.50     Years: 23.00     Pack years: 11.50     Types: Cigarettes     Start date: 05/14/1994   ??? Smokeless tobacco: Never Used   Substance Use Topics   ??? Alcohol use: Yes     Alcohol/week: 12.0 standard drinks     Types: 12 Cans of beer per week     Comment: occ      Current Outpatient Medications   Medication Sig Dispense Refill   ??? venlafaxine (EFFEXOR XR) 75 MG extended release capsule Take 1 capsule by mouth daily 30 capsule 0   ??? conjugated estrogens (PREMARIN) 0.625 MG/GM vaginal cream APPLY A PEA SIZE AMOUNT TO URETHRA DAILY. 30 g 0   ??? atenolol (TENORMIN) 25 MG tablet TAKE 1 TABLET BY MOUTH EVERY DAY 90 tablet 1   ??? ergocalciferol (ERGOCALCIFEROL) 1.25 MG (50000 UT) capsule Take 1 capsule by mouth once a week 12 capsule 1   ??? valACYclovir (VALTREX) 1 g tablet TAKE 2 TABLETS IN THE AM AND PM ONCE AT THE ONSET OF A COLD SORE 30 tablet 1   ??? sodium chloride 0.9 % SOLN with HYDROmorphone HCl PF 50 MG/5ML SOLN 0.2 mg/mL Infuse 0.5 mg/hr intravenously continuous. Indications: pt has pain pump     ??? cyclobenzaprine (FLEXERIL) 10 MG tablet   2      No current facility-administered medications for this visit.      Allergies   Allergen Reactions   ??? Gabapentin      Other reaction(s): Headaches       :      Review of Systems   Constitutional: Negative.  Negative for activity change, appetite change, chills, diaphoresis, fatigue, fever and unexpected weight change.   HENT: Negative.  Negative for ear pain, rhinorrhea, sinus pressure, sneezing, sore throat and trouble swallowing.    Eyes: Negative for photophobia, pain, discharge, redness, itching and visual disturbance.   Respiratory: Negative.  Negative for apnea, cough, choking, chest tightness, shortness of breath, wheezing and stridor.    Cardiovascular: Negative for chest pain, palpitations and leg swelling.   Gastrointestinal: Negative.  Negative for abdominal pain, blood in stool, constipation, diarrhea, nausea and vomiting.   Genitourinary: Negative.  Negative for decreased urine volume, difficulty urinating, dysuria, enuresis, flank pain, frequency, genital sores, hematuria and urgency.   Musculoskeletal: Negative.  Negative for arthralgias, back pain, gait problem, joint swelling, myalgias, neck pain and neck stiffness.   Skin: Negative.  Negative for color change, pallor, rash and wound.   Allergic/Immunologic: Negative.    Neurological: Negative.  Negative for dizziness, facial asymmetry, weakness, light-headedness and headaches.   Psychiatric/Behavioral: Negative for agitation, behavioral problems, confusion, decreased concentration, dysphoric mood, hallucinations, self-injury, sleep disturbance and suicidal ideas. The patient is not nervous/anxious and is not hyperactive.          Objective:     Vitals:    08/10/19 0845   BP: 118/80   Site: Left Upper Arm   Position: Sitting   Cuff Size: Large Adult   Pulse: 97   Temp: 96.6 ??F (35.9 ??C)   SpO2: 97%   Weight: 183 lb (83 kg)   Height: '5\' 6"'  (1.676 m)     Wt Readings from Last 3 Encounters:   08/10/19 183 lb (83 kg)   03/31/19 188 lb (85.3 kg)    01/27/19 190  lb (86.2 kg)     Temp Readings from Last 3 Encounters:   08/10/19 96.6 ??F (35.9 ??C)   03/31/19 97.8 ??F (36.6 ??C)   01/27/19 97.1 ??F (36.2 ??C)     BP Readings from Last 3 Encounters:   08/10/19 118/80   03/31/19 136/88   01/27/19 100/68     Pulse Readings from Last 3 Encounters:   08/10/19 97   03/31/19 100   01/27/19 74     Physical Exam  Vitals signs and nursing note reviewed.   Constitutional:       General: She is not in acute distress.     Appearance: Normal appearance. She is well-developed. She is not diaphoretic.   HENT:      Head: Normocephalic and atraumatic.      Right Ear: Tympanic membrane, ear canal and external ear normal. There is no impacted cerumen.      Left Ear: Tympanic membrane, ear canal and external ear normal. There is no impacted cerumen.      Nose: Nose normal. No congestion or rhinorrhea.      Mouth/Throat:      Mouth: Mucous membranes are moist.      Pharynx: Oropharynx is clear. No oropharyngeal exudate or posterior oropharyngeal erythema.   Eyes:      General: No scleral icterus.        Right eye: No discharge.         Left eye: No discharge.      Extraocular Movements: Extraocular movements intact.      Conjunctiva/sclera: Conjunctivae normal.      Pupils: Pupils are equal, round, and reactive to light.   Neck:      Musculoskeletal: Normal range of motion and neck supple. No neck rigidity or muscular tenderness.      Vascular: No carotid bruit.      Trachea: No tracheal deviation.   Cardiovascular:      Rate and Rhythm: Normal rate and regular rhythm.      Pulses: Normal pulses.      Heart sounds: Normal heart sounds. No murmur. No friction rub. No gallop.    Pulmonary:      Effort: Pulmonary effort is normal. No respiratory distress.      Breath sounds: Normal breath sounds. No stridor. No wheezing, rhonchi or rales.   Chest:      Chest wall: No tenderness.   Abdominal:      General: Bowel sounds are normal. There is no distension.      Palpations: Abdomen is soft.  There is no mass.      Tenderness: There is no abdominal tenderness. There is no right CVA tenderness, left CVA tenderness, guarding or rebound.      Hernia: No hernia is present.   Musculoskeletal: Normal range of motion.         General: No swelling, tenderness, deformity or signs of injury.      Right lower leg: No edema.      Left lower leg: No edema.   Lymphadenopathy:      Cervical: No cervical adenopathy.   Skin:     General: Skin is warm and dry.      Capillary Refill: Capillary refill takes less than 2 seconds.      Coloration: Skin is not jaundiced or pale.      Findings: No bruising, erythema, lesion or rash.   Neurological:      General: No focal deficit present.  Mental Status: She is alert and oriented to person, place, and time. Mental status is at baseline.      Cranial Nerves: No cranial nerve deficit.      Sensory: No sensory deficit.      Motor: No weakness or abnormal muscle tone.      Coordination: Coordination normal.      Gait: Gait normal.      Deep Tendon Reflexes: Reflexes are normal and symmetric. Reflexes normal.   Psychiatric:         Mood and Affect: Mood normal.         Behavior: Behavior normal.         Thought Content: Thought content normal.         Judgment: Judgment normal.         Office Visit on 03/31/2019   Component Date Value Ref Range Status   ??? WBC 03/31/2019 11.0  4.0 - 11.0 K/uL Final   ??? RBC 03/31/2019 4.36  4.00 - 5.20 M/uL Final   ??? Hemoglobin 03/31/2019 14.2  12.0 - 16.0 g/dL Final   ??? Hematocrit 03/31/2019 42.3  36.0 - 48.0 % Final   ??? MCV 03/31/2019 97.2  80.0 - 100.0 fL Final   ??? MCH 03/31/2019 32.7  26.0 - 34.0 pg Final   ??? MCHC 03/31/2019 33.6  31.0 - 36.0 g/dL Final   ??? RDW 03/31/2019 13.1  12.4 - 15.4 % Final   ??? Platelets 03/31/2019 465* 135 - 450 K/uL Final   ??? MPV 03/31/2019 7.5  5.0 - 10.5 fL Final   ??? Neutrophils % 03/31/2019 64.0  % Final   ??? Lymphocytes % 03/31/2019 27.3  % Final   ??? Monocytes % 03/31/2019 5.8  % Final   ??? Eosinophils % 03/31/2019  1.9  % Final   ??? Basophils % 03/31/2019 1.0  % Final   ??? Neutrophils Absolute 03/31/2019 7.0  1.7 - 7.7 K/uL Final   ??? Lymphocytes Absolute 03/31/2019 3.0  1.0 - 5.1 K/uL Final   ??? Monocytes Absolute 03/31/2019 0.6  0.0 - 1.3 K/uL Final   ??? Eosinophils Absolute 03/31/2019 0.2  0.0 - 0.6 K/uL Final   ??? Basophils Absolute 03/31/2019 0.1  0.0 - 0.2 K/uL Final   ??? Sodium 03/31/2019 143  136 - 145 mmol/L Final   ??? Potassium 03/31/2019 5.0  3.5 - 5.1 mmol/L Final   ??? Chloride 03/31/2019 105  99 - 110 mmol/L Final   ??? CO2 03/31/2019 26  21 - 32 mmol/L Final   ??? Anion Gap 03/31/2019 12  3 - 16 Final   ??? Glucose 03/31/2019 99  70 - 99 mg/dL Final   ??? BUN 03/31/2019 9  7 - 20 mg/dL Final   ??? CREATININE 03/31/2019 0.8  0.6 - 1.1 mg/dL Final   ??? GFR Non-African American 03/31/2019 >60  >60 Final    Comment: >60 mL/min/1.51m EGFR, calc. for ages 130and older using the  MDRD formula (not corrected for weight), is valid for stable  renal function.     ??? GFR African American 03/31/2019 >60  >60 Final    Comment: Chronic Kidney Disease: less than 60 ml/min/1.73 sq.m.          Kidney Failure: less than 15 ml/min/1.73 sq.m.  Results valid for patients 18 years and older.     ??? Calcium 03/31/2019 10.5  8.3 - 10.6 mg/dL Final   ??? Total Protein 03/31/2019 7.7  6.4 - 8.2 g/dL Final   ???  Albumin 03/31/2019 4.7  3.4 - 5.0 g/dL Final   ??? Albumin/Globulin Ratio 03/31/2019 1.6  1.1 - 2.2 Final   ??? Total Bilirubin 03/31/2019 <0.2  0.0 - 1.0 mg/dL Final   ??? Alkaline Phosphatase 03/31/2019 86  40 - 129 U/L Final   ??? ALT 03/31/2019 30  10 - 40 U/L Final   ??? AST 03/31/2019 23  15 - 37 U/L Final   ??? Globulin 03/31/2019 3.0  g/dL Final   ??? Cholesterol, Total 03/31/2019 223* 0 - 199 mg/dL Final   ??? Triglycerides 03/31/2019 194* 0 - 150 mg/dL Final   ??? HDL 03/31/2019 88* 40 - 60 mg/dL Final   ??? LDL Calculated 03/31/2019 96  <100 mg/dL Final   ??? VLDL Cholesterol Calculated 03/31/2019 39  Not Established mg/dL Final   ??? Vit D, 25-Hydroxy 03/31/2019 27.4*  >=30 ng/mL Final    Comment: <=20 ng/mL............Marland KitchenDeficient  21-29 ng/mL..........Marland KitchenInsufficient  >=30 ng/mL.........Marland KitchenSufficient     ??? Vitamin B-12 03/31/2019 501  211 - 911 pg/mL Final   ??? Folate 03/31/2019 >20.00  4.78 - 24.20 ng/mL Final    Comment: Effective 03-29-15 10:00am EST  Please note reference ranges have  changed for Folate.     ??? TSH 03/31/2019 0.97  0.27 - 4.20 uIU/mL Final           Assessment & Plan:     The following diagnoses and conditions are stable with no further orders unless indicated:  1. Postmenopausal    2. Essential hypertension    3. Mixed hyperlipidemia    4. Anxiety with depression    5. Vitamin D deficiency    6. Tobacco abuse    7. Encounter for screening colonoscopy        Breella was seen today for hyperlipidemia, hypertension and anxiety.    BP stable.  No changes in medication.  She is not taking Atorvastatin anymore.  She is working on diet and wants to stop smoking once she moves to [place].  She may discuss more with her new PCP in the future.  Did discuss with her that her ASCVD risk score is 3% now and she may stop the Atorvastatin if she wishes.  She is doing well with her vitamin D supplementation.  Will switch her from Lexapro to Effexor for her postmenopausal symptoms and for her anxiety and depression.  She is to call in 2-3 weeks with how she is doing with the Effexor.  She verbalizes understanding.     She is to schedule with GI for colonoscopy.  Referral given.  Recommend smoking cessation.  Recommend her schedule her mammogram.     Diagnoses and all orders for this visit:    Postmenopausal  -     venlafaxine (EFFEXOR XR) 75 MG extended release capsule; Take 1 capsule by mouth daily    Essential hypertension    Mixed hyperlipidemia    Anxiety with depression    Vitamin D deficiency    Tobacco abuse    Encounter for screening colonoscopy  -     [facility] - Renella Cunas, MD, Gastroenterology, [place]      Prior to Visit Medications    Medication Sig  Taking? Authorizing Provider   venlafaxine (EFFEXOR XR) 75 MG extended release capsule Take 1 capsule by mouth daily Yes Tarri Fuller, APRN - CNP   conjugated estrogens (PREMARIN) 0.625 MG/GM vaginal cream APPLY A PEA SIZE AMOUNT TO URETHRA DAILY. Yes Tarri Fuller, APRN - CNP  atenolol (TENORMIN) 25 MG tablet TAKE 1 TABLET BY MOUTH EVERY DAY Yes Tarri Fuller, APRN - CNP   ergocalciferol (ERGOCALCIFEROL) 1.25 MG (50000 UT) capsule Take 1 capsule by mouth once a week Yes Tarri Fuller, APRN - CNP   valACYclovir (VALTREX) 1 g tablet TAKE 2 TABLETS IN THE AM AND PM ONCE AT THE ONSET OF A COLD SORE Yes Denice Bors, MD   sodium chloride 0.9 % SOLN with HYDROmorphone HCl PF 50 MG/5ML SOLN 0.2 mg/mL Infuse 0.5 mg/hr intravenously continuous. Indications: pt has pain pump Yes Historical Provider, MD   cyclobenzaprine (FLEXERIL) 10 MG tablet  Yes Historical Provider, MD     Orders Placed This Encounter   Medications   ??? venlafaxine (EFFEXOR XR) 75 MG extended release capsule     Sig: Take 1 capsule by mouth daily     Dispense:  30 capsule     Refill:  0         Return in about 8 months (around 04/11/2020) for Annual physical .    Patient should call the office immediately with new or ongoing signs or symptoms or worsening, or proceedto the emergency room.  No changes in past medical history, past surgical history, social history, or family history were noted during the patient encounter unless specifically listed above.  All updates of past medicalhistory, past surgical history, social history, or family history were reviewed personally by me during the office visit.  All problems listed in the assessment are stable unless noted otherwise.  Medication profilereviewed personally by me during the office visit.  Medication side effects and possible impairments from medications were discussed as applicable.     Call if pattern of symptoms change or persists for an extended  time.    This document was prepared by a combination of typing and transcription through a voice recognition software.    All medications have the potential for adverse effects. All medications effect each person differently. Please read and review provided information related to medication. If the medication that you have been prescribed has the potential to cause sedation, do not drive or operate car, truck, or heavy machinery until you know how the medication will effect you. If you experience any adverse effects from the medication, please call the office or report to the emergency department.    The 10-year ASCVD risk score Mikey Bussing DC Brooke Bonito., et al., 2013) is: 3.1%    Values used to calculate the score:      Age: 75 years      Sex: Female      Is Non-Hispanic African American: No      Diabetic: No      Tobacco smoker: Yes      Systolic Blood Pressure: 102 mmHg      Is BP treated: Yes      HDL Cholesterol: 88 mg/dL      Total Cholesterol: 223 mg/dL    Tobacco abuse: Patient was counseled about stopping tobacco use. Discussed harmful effects of continued tobacco use including COPD, cardiovascular disease, and/or death. Discussed the increased risk of pneumonia as well as the potential for substantial decline in lung function. The following recommendations were given to improve chances of quitting:  ??  1)                   Chances of stopping improve with each attempt  ??  2)  Encourage all other occupants in the house to quit  ??  3)                   Remove all tobacco products from home, work, and vehicles  ??  4)                   Tobacco cessation aids such as nicotine replacement therapy options    It is very important that she quit smoking. There are various alternatives available to help with this difficult task, but first and foremost, she must make a firm commitment and decision to quit. The nature of nicotine addiction is discussed. The usefulness of behavioral therapy is discussed and  suggested.  The correct use, cost and side effects of nicotine replacement therapy such as gum or patches is discussed. Bupropion and its cost (sometimes not covered fully by insurance) and side effects are reviewed. The quit rates are discussed. I recommend she not allow potential costs of treatment to deter her from using nicotine replacement therapy or bupropion, as the long term economic and health benefits are obvious.    See someone right away if you want to hurt or kill yourself! -- If you ever feel like you might hurt yourself or someone else, do one of these things:  ?Call your doctor or nurse and tell them it is urgent  ?Call for an ambulance (in the [place] and [place], [place] 9-1-1)  ?Go to the emergency room at your local hospital  ?Call the [facility]:  ???[phone]    I've explained to her that drugs of the SSRI class can have side effects such as weight gain, sexual dysfunction, insomnia, headache, nausea. These medications are generally effective at alleviating symptoms of anxiety and/or depression. Let me know if significant side effects do occur.

## 2019-09-01 ENCOUNTER — Encounter

## 2019-09-01 MED ORDER — VENLAFAXINE HCL ER 75 MG PO CP24
75 MG | ORAL_CAPSULE | ORAL | 2 refills | Status: DC
Start: 2019-09-01 — End: 2019-09-02

## 2019-09-01 NOTE — Telephone Encounter (Signed)
Last office visit was 08/10/19  No future appointments.

## 2019-09-02 ENCOUNTER — Telehealth

## 2019-09-02 MED ORDER — VENLAFAXINE HCL ER 150 MG PO CP24
150 MG | ORAL_CAPSULE | Freq: Every day | ORAL | 1 refills | Status: DC
Start: 2019-09-02 — End: 2020-03-14

## 2019-09-02 NOTE — Telephone Encounter (Signed)
Pt was wanting to see if she could get her Venlafaxine increased from 75 mg to 150. She uses [facility] in [place].

## 2019-09-02 NOTE — Telephone Encounter (Signed)
Please send Rx request to this provider

## 2019-09-28 ENCOUNTER — Encounter

## 2019-09-28 NOTE — Telephone Encounter (Signed)
Only needs to take vitamin D3 2,000 units daily.  She already completed weekly vitamin D3 for 6 months and may just continue on daily vitamin D3 to maintain vitamin D3 levels.

## 2019-09-28 NOTE — Telephone Encounter (Signed)
Last OV 08/10/19  No future appointments.

## 2019-09-28 NOTE — Telephone Encounter (Signed)
Unable to reach patient, no VM set up

## 2019-10-16 ENCOUNTER — Inpatient Hospital Stay: Admit: 2019-10-16 | Payer: BLUE CROSS/BLUE SHIELD

## 2019-10-16 ENCOUNTER — Inpatient Hospital Stay: Payer: BLUE CROSS/BLUE SHIELD

## 2019-10-16 ENCOUNTER — Telehealth: Admit: 2019-10-16 | Discharge: 2019-10-16 | Payer: BLUE CROSS/BLUE SHIELD | Attending: Registered Nurse

## 2019-10-16 ENCOUNTER — Encounter

## 2019-10-16 DIAGNOSIS — N390 Urinary tract infection, site not specified: Secondary | ICD-10-CM

## 2019-10-16 DIAGNOSIS — R109 Unspecified abdominal pain: Secondary | ICD-10-CM

## 2019-10-16 LAB — CBC WITH AUTO DIFFERENTIAL
Basophils %: 0.7 %
Basophils Absolute: 0.1 10*3/uL (ref 0.0–0.2)
Eosinophils %: 0.1 %
Eosinophils Absolute: 0 10*3/uL (ref 0.0–0.6)
Hematocrit: 38.6 % (ref 36.0–48.0)
Hemoglobin: 13.2 g/dL (ref 12.0–16.0)
Lymphocytes %: 10.8 %
Lymphocytes Absolute: 2 10*3/uL (ref 1.0–5.1)
MCH: 32.3 pg (ref 26.0–34.0)
MCHC: 34.2 g/dL (ref 31.0–36.0)
MCV: 94.3 fL (ref 80.0–100.0)
MPV: 7.1 fL (ref 5.0–10.5)
Monocytes %: 8.3 %
Monocytes Absolute: 1.5 10*3/uL — ABNORMAL HIGH (ref 0.0–1.3)
Neutrophils %: 80.1 %
Neutrophils Absolute: 14.7 10*3/uL — ABNORMAL HIGH (ref 1.7–7.7)
Platelets: 335 10*3/uL (ref 135–450)
RBC: 4.1 M/uL (ref 4.00–5.20)
RDW: 14.7 % (ref 12.4–15.4)
WBC: 18.3 10*3/uL — ABNORMAL HIGH (ref 4.0–11.0)

## 2019-10-16 LAB — BASIC METABOLIC PANEL
Anion Gap: 12 (ref 3–16)
BUN: 8 mg/dL (ref 7–20)
CO2: 25 mmol/L (ref 21–32)
Calcium: 9.2 mg/dL (ref 8.3–10.6)
Chloride: 94 mmol/L — ABNORMAL LOW (ref 99–110)
Creatinine: 0.9 mg/dL (ref 0.6–1.1)
GFR African American: >60 (ref >60)
GFR Non-African American: >60 (ref >60)
Glucose: 106 mg/dL — ABNORMAL HIGH (ref 70–99)
Potassium: 3.9 mmol/L (ref 3.5–5.1)
Sodium: 131 mmol/L — ABNORMAL LOW (ref 136–145)

## 2019-10-16 LAB — MICROSCOPIC URINALYSIS: WBC, UA: >100 /HPF — AB (ref 0–5)

## 2019-10-16 LAB — URINALYSIS WITH REFLEX TO CULTURE
Bilirubin Urine: NEGATIVE
Glucose, Ur: NEGATIVE mg/dL
Nitrite, Urine: POSITIVE — AB
Protein, UA: 100 mg/dL — AB
Specific Gravity, UA: 1.015 (ref 1.005–1.030)
Urobilinogen, Urine: 1 E.U./dL (ref <2.0)
pH, UA: 6 (ref 5.0–8.0)

## 2019-10-16 LAB — COVID-19: SARS-CoV-2: NEGATIVE

## 2019-10-16 MED ORDER — GUAIFENESIN ER 600 MG PO TB12
600 MG | ORAL_TABLET | Freq: Two times a day (BID) | ORAL | 0 refills | Status: AC
Start: 2019-10-16 — End: 2019-10-31

## 2019-10-16 MED ORDER — ALBUTEROL SULFATE HFA 108 (90 BASE) MCG/ACT IN AERS
108 (90 Base) MCG/ACT | Freq: Four times a day (QID) | RESPIRATORY_TRACT | 0 refills | Status: AC | PRN
Start: 2019-10-16 — End: ?

## 2019-10-16 MED ORDER — METHYLPREDNISOLONE 4 MG PO TBPK
4 MG | PACK | ORAL | 0 refills | Status: AC
Start: 2019-10-16 — End: ?

## 2019-10-16 MED ORDER — LEVOFLOXACIN 500 MG PO TABS
500 MG | ORAL_TABLET | ORAL | 0 refills | Status: AC
Start: 2019-10-16 — End: ?

## 2019-10-16 MED ORDER — SULFAMETHOXAZOLE-TRIMETHOPRIM 800-160 MG PO TABS
800-160 MG | ORAL_TABLET | Freq: Two times a day (BID) | ORAL | 0 refills | Status: DC
Start: 2019-10-16 — End: 2019-10-16

## 2019-10-16 NOTE — Telephone Encounter (Signed)
Virtual appointment scheduled

## 2019-10-16 NOTE — Telephone Encounter (Signed)
Pt's husband called and said that pt has been feeling really bad. She has been having a fever for 1 1/2 days and abdominal pain for 2 days. Was wanting to see if can go a virtual appointment. Please call [phone].

## 2019-10-16 NOTE — Progress Notes (Signed)
10/16/2019    TELEHEALTH EVALUATION -- Audio/Visual (During COVID-19 public health emergency)    HPI:    Tanya Velasquez (DOB:  Sep 21, 1965) has requested an audio/video evaluation for the following concern(s):    Abdominal Pain  This is a new problem. The current episode started in the past 7 days. The onset quality is sudden. The problem occurs constantly. The problem has been gradually worsening. The pain is located in the suprapubic region, right flank and left flank (low back pain ). The pain is at a severity of 6/10. The pain is moderate. The quality of the pain is aching, dull and a sensation of fullness. The abdominal pain radiates to the left flank, back and right flank. Associated symptoms include anorexia, dysuria, a fever (99.5), frequency, headaches and myalgias. Pertinent negatives include no arthralgias, belching, constipation, diarrhea, flatus, hematochezia, hematuria, melena, nausea, vomiting or weight loss. Nothing (Constant ) aggravates the pain. The pain is relieved by nothing. She has tried nothing for the symptoms. The treatment provided no relief. Her past medical history is significant for GERD. There is no history of abdominal surgery, colon cancer, Crohn's disease, gallstones, irritable bowel syndrome, pancreatitis, PUD or ulcerative colitis.   URI   This is a new problem. The current episode started yesterday. The problem has been gradually worsening. Maximum temperature: 99.5. The fever has been present for 1 to 2 days. Associated symptoms include abdominal pain, congestion, coughing (Dry; worse at night ), dysuria, headaches, neck pain, sinus pain, sneezing, a sore throat, swollen glands (Around tonsil region) and wheezing. Pertinent negatives include no chest pain, diarrhea, ear pain, joint pain, joint swelling, nausea, plugged ear sensation, rash, rhinorrhea or vomiting. She has tried acetaminophen for the symptoms. The treatment provided mild relief.     She admits to her daughter  passing away unexpectedly three days ago.  She had not bee eating or drinking much.  She has been in bed crying for the last three days.       Review of Systems   Constitutional: Positive for activity change, appetite change, chills, fatigue and fever (99.5). Negative for diaphoresis, unexpected weight change and weight loss.   HENT: Positive for congestion, postnasal drip, sinus pressure, sinus pain, sneezing and sore throat. Negative for dental problem, drooling, ear discharge, ear pain, facial swelling, hearing loss, mouth sores, nosebleeds, rhinorrhea, tinnitus, trouble swallowing and voice change.    Respiratory: Positive for cough (Dry; worse at night ) and wheezing. Negative for apnea, choking, chest tightness, shortness of breath and stridor.    Cardiovascular: Negative.  Negative for chest pain.   Gastrointestinal: Positive for abdominal pain and anorexia. Negative for abdominal distention, anal bleeding, blood in stool, constipation, diarrhea, flatus, hematochezia, melena, nausea, rectal pain and vomiting.   Genitourinary: Positive for dysuria and frequency. Negative for decreased urine volume, difficulty urinating, dyspareunia, enuresis, flank pain, genital sores, hematuria, menstrual problem, pelvic pain, urgency, vaginal bleeding, vaginal discharge and vaginal pain.   Musculoskeletal: Positive for myalgias and neck pain. Negative for arthralgias, back pain, gait problem, joint pain, joint swelling and neck stiffness.   Skin: Negative.  Negative for rash.   Allergic/Immunologic: Negative for environmental allergies, food allergies and immunocompromised state.   Neurological: Positive for headaches. Negative for dizziness, tremors, seizures, syncope, facial asymmetry, speech difficulty, weakness, light-headedness and numbness.       Prior to Visit Medications    Medication Sig Taking? Authorizing Provider   levoFLOXacin (LEVAQUIN) 500 MG tablet 1 daily by mouth Yes Santina Evans  D Ervin Rothbauer, APRN - CNP    albuterol sulfate HFA (VENTOLIN HFA) 108 (90 Base) MCG/ACT inhaler Inhale 2 puffs into the lungs 4 times daily as needed for Wheezing or Shortness of Breath Yes Dierdre Harnessatherine D Tarvis Blossom, APRN - CNP   methylPREDNISolone (MEDROL DOSEPACK) 4 MG tablet Use as directed per package instructions Yes Dierdre Harnessatherine D Amire Leazer, APRN - CNP   guaiFENesin (MUCINEX) 600 MG extended release tablet Take 1 tablet by mouth 2 times daily for 15 days Yes Dierdre Harnessatherine D Elasia Furnish, APRN - CNP   venlafaxine (EFFEXOR XR) 150 MG extended release capsule Take 1 capsule by mouth daily Yes Dierdre Harnessatherine D Doreen Garretson, APRN - CNP   conjugated estrogens (PREMARIN) 0.625 MG/GM vaginal cream APPLY A PEA SIZE AMOUNT TO URETHRA DAILY. Yes Dierdre Harnessatherine D Chyanna Flock, APRN - CNP   atenolol (TENORMIN) 25 MG tablet TAKE 1 TABLET BY MOUTH EVERY DAY Yes Dierdre Harnessatherine D Taegen Lennox, APRN - CNP   ergocalciferol (ERGOCALCIFEROL) 1.25 MG (50000 UT) capsule Take 1 capsule by mouth once a week Yes Dierdre Harnessatherine D Shelden Raborn, APRN - CNP   valACYclovir (VALTREX) 1 g tablet TAKE 2 TABLETS IN THE AM AND PM ONCE AT THE ONSET OF A COLD SORE Yes Gwenlyn Foundharles Timothy McKinley, MD   sodium chloride 0.9 % SOLN with HYDROmorphone HCl PF 50 MG/5ML SOLN 0.2 mg/mL Infuse 0.5 mg/hr intravenously continuous. Indications: pt has pain pump Yes Historical Provider, MD   cyclobenzaprine (FLEXERIL) 10 MG tablet  Yes Historical Provider, MD       Social History     Tobacco Use   ??? Smoking status: Current Every Day Smoker     Packs/day: 0.50     Years: 23.00     Pack years: 11.50     Types: Cigarettes     Start date: 05/14/1994   ??? Smokeless tobacco: Never Used   Vaping Use   ??? Vaping Use: Never used   Substance Use Topics   ??? Alcohol use: Yes     Alcohol/week: 12.0 standard drinks     Types: 12 Cans of beer per week     Comment: occ   ??? Drug use: Never        Allergies   Allergen Reactions   ??? Gabapentin      Other reaction(s): Headaches   ,   Past Medical History:   Diagnosis Date   ??? Chronic back pain    ??? Chronic hip pain  2009   ??? Depression    ??? Essential hypertension 12/05/2017   ??? Mixed hyperlipidemia 07/08/2018   ??? Moderate aortic regurgitation 07/08/2018   ??? Obesity    ??? Osteoarthritis    ??? Substance abuse (HCC)     Tobacco abuse    ??? Tobacco abuse 01/30/2018   ,   Past Surgical History:   Procedure Laterality Date   ??? BACK SURGERY  05/2017    pt has an intrathecal pain pump implanted in her back.    ??? ELBOW SURGERY Left     Childhood    ??? HYSTERECTOMY, VAGINAL     ??? TOTAL HIP ARTHROPLASTY Right 2009   ,   Social History     Tobacco Use   ??? Smoking status: Current Every Day Smoker     Packs/day: 0.50     Years: 23.00     Pack years: 11.50     Types: Cigarettes     Start date: 05/14/1994   ??? Smokeless tobacco: Never Used   Vaping Use   ???  Vaping Use: Never used   Substance Use Topics   ??? Alcohol use: Yes     Alcohol/week: 12.0 standard drinks     Types: 12 Cans of beer per week     Comment: occ   ??? Drug use: Never   ,   Family History   Problem Relation Age of Onset   ??? Breast Cancer Mother    ??? Heart Attack Father    ??? High Blood Pressure Father    ??? High Cholesterol Father    ??? Breast Cancer Sister    ,   Immunization History   Administered Date(s) Administered   ??? Tdap (Boostrix, Adacel) 12/05/2017   ,   Health Maintenance   Topic Date Due   ??? Diabetes screen  Never done   ??? Colon cancer screen colonoscopy  Never done   ??? Flu vaccine (Season Ended) 01/27/2020 (Originally 01/13/2020)   ??? Shingles Vaccine (1 of 2) 08/09/2020 (Originally 10/24/2016)   ??? COVID-19 Vaccine (1) 10/18/2020 (Originally 10/25/1978)   ??? Pneumococcal 0-64 years Vaccine (1 of 2 - PPSV23) 06/27/2022 (Originally 10/24/1972)   ??? Breast cancer screen  01/08/2020   ??? Potassium monitoring  10/15/2020   ??? Creatinine monitoring  10/15/2020   ??? Lipid screen  03/30/2024   ??? DTaP/Tdap/Td vaccine (2 - Td or Tdap) 12/06/2027   ??? Hepatitis C screen  Completed   ??? HIV screen  Completed   ??? Hepatitis A vaccine  Aged Out   ??? Hepatitis B vaccine  Aged Out   ??? Hib vaccine  Aged Out   ???  Meningococcal (ACWY) vaccine  Aged Out       PHYSICAL EXAMINATION:  [ INSTRUCTIONS:  "[x] " Indicates a positive item  "[] " Indicates a negative item      Constitutional: [x]  Appears well-developed and well-nourished [x]  No apparent distress      []  Abnormal-   Mental status  [x]  Alert and awake  [x]  Oriented to person/place/time [x] Able to follow commands      Eyes:  EOM    [x]   Normal  []  Abnormal-  Sclera  [x]   Normal  []  Abnormal -         Discharge [x]   None visible  []  Abnormal -    HENT:   [x]  Normocephalic, atraumatic.  []  Abnormal   [x]  Mouth/Throat: Mucous membranes are moist.     External Ears [x]  Normal  []  Abnormal-     Neck: [x]  No visualized mass     Pulmonary/Chest: [x]  Respiratory effort normal.  [x]  No visualized signs of difficulty breathing or respiratory distress        []  Abnormal-      Musculoskeletal:   [x]  Normal gait with no signs of ataxia         [x]  Normal range of motion of neck        []  Abnormal-       Neurological:        [x]  No Facial Asymmetry (Cranial nerve 7 motor function) (limited exam to video visit)          [x]  No gaze palsy        []  Abnormal-         Skin:        [x]  No significant exanthematous lesions or discoloration noted on facial skin         []  Abnormal-            Psychiatric:       [  x] Normal Affect [x]  No Hallucinations        []  Abnormal-     ASSESSMENT/PLAN:  Ridhima was seen today for generalized body aches, fever, fatigue and other.    Urine analysis revealed nitrates.  CT scan that was ordered did not reveal any kidney stones.  Pneumonia was detected in right middle lobe of CT scan.  Levaquin sent into the pharmacy to help with UTI and pneumonia.  BMP did reveal lower sodium and chloride levels.  Recommend her trying to increase her fluid intake with electrolyte such as Gatorade and Powerade.  Albuterol inhaler sent into the pharmacy to help with any wheezing or shortness of breath.  Medrol Dosepak to help with inflammation and Mucinex to help with chest  congestion.  Covid rapid test negative.  Waiting on Covid PCR.  Patient is to follow-up in 3 days in office.  Will recheck CBC and BMP at that time.  Did discuss with patient that if any of her symptoms seem to worsen, she is to go to the ER.  She verbalizes understanding.  Did discuss with her that her CT scan did reveal some other findings where she will need to do a follow-up CT scan and a colonoscopy in the future.  Will discuss further with her on Monday.  She verbalized understanding.    Diagnoses and all orders for this visit:    Acute UTI  -     CBC WITH AUTO DIFFERENTIAL; Future  -     URINE RT REFLEX TO CULTURE; Future  -     Basic Metabolic Panel; Future    Pneumonia of right middle lobe due to infectious organism  -     levoFLOXacin (LEVAQUIN) 500 MG tablet; 1 daily by mouth  -     albuterol sulfate HFA (VENTOLIN HFA) 108 (90 Base) MCG/ACT inhaler; Inhale 2 puffs into the lungs 4 times daily as needed for Wheezing or Shortness of Breath  -     methylPREDNISolone (MEDROL DOSEPACK) 4 MG tablet; Use as directed per package instructions  -     guaiFENesin (MUCINEX) 600 MG extended release tablet; Take 1 tablet by mouth 2 times daily for 15 days    Bilateral flank pain  -     CBC WITH AUTO DIFFERENTIAL; Future  -     URINE RT REFLEX TO CULTURE; Future  -     Basic Metabolic Panel; Future  -     CT ABDOMEN PELVIS WO CONTRAST Additional Contrast? None; Future        Return in about 3 days (around 10/19/2019), or if symptoms worsen or fail to improve, for Pneumonia follow up.    Tanya Velasquez is a 54 y.o. female being evaluated by a Virtual Visit (video visit) encounter to address concerns as mentioned above.  A caregiver was present when appropriate. Due to this being a [name] (During COVID-19 public health emergency), evaluation of the following organ systems was limited: Vitals/Constitutional/EENT/Resp/CV/GI/GU/MS/Neuro/Skin/Heme-Lymph-Imm.  Pursuant to the emergency declaration under the  [facility] Act and the Scientist, research (medical), 1135 waiver authority and the [facility] and [employer] Act, this Virtual Visit was conducted with patient's (and/or legal guardian's) consent, to reduce the patient's risk of exposure to COVID-19 and provide necessary medical care.  The patient (and/or legal guardian) has also been advised to contact this office for worsening conditions or problems, and seek emergency medical treatment and/or call 911 if deemed necessary.     Patient identification  was verified at the start of the visit: Yes    Services were provided through a video synchronous discussion virtually to substitute for in-person clinic visit. Patient and provider were located at their individual homes.    --[name], APRN - CNP on 10/18/2019 at 12:16 PM    An electronic signature was used to authenticate this note.    Patient should call the office immediately with new or ongoing signs or symptoms or worsening, or proceed to the emergency room.  All entries in chief complaint and history of present illness are reviewed and validated by me.  No changes in past medical history, past surgical history, social history, or family history were noted during the patient encounter unless specifically listed above.  All updates of past medical history, past surgical history, social history, or family history were reviewed personally by me during the office visit.  All problems listed in the assessment are stable unless noted otherwise.  Medication profile reviewed personally by me during the office visit.  Medication side effects and possible impairments from medications were discussed as applicable.     Every effort has been made to assure accurate transcription by this voice recognition software.  However, mistakes in transcription may still occur    You are being started on a new medication. All medications have the potential for adverse effects. All medications  effect each person differently. Please read and review provided information related to medication. If the medication that you have been prescribed has the potential to cause sedation, do not drive or operate car, truck, or heavy machinery until you know how the medication will effect you. If you experience any adverse effects from the medication, please call the office or report to the emergency department.

## 2019-10-16 NOTE — Telephone Encounter (Signed)
Want to do visit now?

## 2019-10-16 NOTE — Telephone Encounter (Signed)
Patient advised and put on schedule for Monday

## 2019-10-16 NOTE — Telephone Encounter (Signed)
-----   Message from [name], APRN - CNP sent at 10/16/2019  4:08 PM EDT -----  Negative for any viral URI infections including COVID.  Come in on Monday at 1140?     Symptoms worsen over the weekend, she is to go to the ER

## 2019-10-17 LAB — COVID-19: SARS-CoV-2: NEGATIVE

## 2019-10-19 ENCOUNTER — Ambulatory Visit: Admit: 2019-10-19 | Discharge: 2019-10-19 | Payer: BLUE CROSS/BLUE SHIELD | Attending: Registered Nurse

## 2019-10-19 DIAGNOSIS — J189 Pneumonia, unspecified organism: Secondary | ICD-10-CM

## 2019-10-19 LAB — CULTURE, URINE: Urine Culture, Routine: >100000

## 2019-10-19 NOTE — Progress Notes (Signed)
[facility]  [facility]  [address] [address]  Dept: [phone]  Dept Fax: [phone]  Loc: [phone]    Tanya Velasquez is a 54 y.o. female who presents today for her medical conditions/complaints as noted below.  Tanya Velasquez is c/o of URI (pt is here for a follow up on pnuemonia. pt has been on medication for the past 4 days and is feeing some better. )        HPI:     Chief Complaint   Patient presents with   ??? URI     pt is here for a follow up on pnuemonia. pt has been on medication for the past 4 days and is feeing some better.        HPI    Tanya Velasquez presents to the office today to follow up on her virtual visit from 10/16/2019 where she was diagnosed with acute urinary tract infection and right middle lobe pneumonia per CT scan.  She has been taking Levaquin as directed and states she feels a better.  She is drinking fluids.  She has not noticed anymore fluids, but admits to having night sweats.  She has been moving around more.  She is not having any burning with urination and bilateral flank pain has resolved. She is not having anymore abdominal pain.  She is not having any blood in her urine and feels like her urine is clear now.  She admits she never started the medrol dose pack.  She has been taking the Levaquin and the Ventolin inhaler as needed. She has been taking Mucinex as prescribed and feels like her cough is not as severe.  She feels like her cough is productive now.     Past Medical History:   Diagnosis Date   ??? Chronic back pain    ??? Chronic hip pain 2009   ??? Depression    ??? Essential hypertension 12/05/2017   ??? Mixed hyperlipidemia 07/08/2018   ??? Moderate aortic regurgitation 07/08/2018   ??? Obesity    ??? Osteoarthritis    ??? Substance abuse ([place])     Tobacco abuse    ??? Tobacco abuse 01/30/2018      Past Surgical History:   Procedure Laterality Date   ??? BACK SURGERY  05/2017    pt has an intrathecal pain pump implanted in her back.    ??? ELBOW  SURGERY Left     Childhood    ??? HYSTERECTOMY, VAGINAL     ??? TOTAL HIP ARTHROPLASTY Right 2009       Family History   Problem Relation Age of Onset   ??? Breast Cancer Mother    ??? Heart Attack Father    ??? High Blood Pressure Father    ??? High Cholesterol Father    ??? Breast Cancer Sister        Social History     Tobacco Use   ??? Smoking status: Current Every Day Smoker     Packs/day: 0.50     Years: 23.00     Pack years: 11.50     Types: Cigarettes     Start date: 05/14/1994   ??? Smokeless tobacco: Never Used   Substance Use Topics   ??? Alcohol use: Yes     Alcohol/week: 12.0 standard drinks     Types: 12 Cans of beer per week     Comment: occ      Current Outpatient  Medications   Medication Sig Dispense Refill   ??? levoFLOXacin (LEVAQUIN) 500 MG tablet 1 daily by mouth 7 tablet 0   ??? albuterol sulfate HFA (VENTOLIN HFA) 108 (90 Base) MCG/ACT inhaler Inhale 2 puffs into the lungs 4 times daily as needed for Wheezing or Shortness of Breath 1 Inhaler 0   ??? methylPREDNISolone (MEDROL DOSEPACK) 4 MG tablet Use as directed per package instructions 1 kit 0   ??? guaiFENesin (MUCINEX) 600 MG extended release tablet Take 1 tablet by mouth 2 times daily for 15 days 30 tablet 0   ??? venlafaxine (EFFEXOR XR) 150 MG extended release capsule Take 1 capsule by mouth daily 90 capsule 1   ??? conjugated estrogens (PREMARIN) 0.625 MG/GM vaginal cream APPLY A PEA SIZE AMOUNT TO URETHRA DAILY. 30 g 0   ??? atenolol (TENORMIN) 25 MG tablet TAKE 1 TABLET BY MOUTH EVERY DAY 90 tablet 1   ??? ergocalciferol (ERGOCALCIFEROL) 1.25 MG (50000 UT) capsule Take 1 capsule by mouth once a week 12 capsule 1   ??? valACYclovir (VALTREX) 1 g tablet TAKE 2 TABLETS IN THE AM AND PM ONCE AT THE ONSET OF A COLD SORE 30 tablet 1   ??? sodium chloride 0.9 % SOLN with HYDROmorphone HCl PF 50 MG/5ML SOLN 0.2 mg/mL Infuse 0.5 mg/hr intravenously continuous. Indications: pt has pain pump     ??? cyclobenzaprine (FLEXERIL) 10 MG tablet   2     No current facility-administered  medications for this visit.     Allergies   Allergen Reactions   ??? Gabapentin      Other reaction(s): Headaches       :      Review of Systems      Objective:     Vitals:    10/19/19 1147 10/19/19 1150 10/19/19 1212   BP: (!) 130/99 (!) 124/95 122/84   Site: Right Upper Arm Right Upper Arm Right Upper Arm   Position: Sitting Sitting Sitting   Cuff Size: Medium Adult Medium Adult Medium Adult   Pulse: 122 114    Temp: 98.1 ??F (36.7 ??C)     SpO2: 97%     Weight: 173 lb (78.5 kg)     Height: '5\' 6"'  (1.676 m)       Wt Readings from Last 3 Encounters:   10/19/19 173 lb (78.5 kg)   08/10/19 183 lb (83 kg)   03/31/19 188 lb (85.3 kg)     Temp Readings from Last 3 Encounters:   10/19/19 98.1 ??F (36.7 ??C)   08/10/19 96.6 ??F (35.9 ??C)   03/31/19 97.8 ??F (36.6 ??C)     BP Readings from Last 3 Encounters:   10/19/19 122/84   08/10/19 118/80   03/31/19 136/88     Pulse Readings from Last 3 Encounters:   10/19/19 114   08/10/19 97   03/31/19 100     Physical Exam  Vitals and nursing note reviewed.   Constitutional:       General: She is not in acute distress.     Appearance: Normal appearance. She is well-developed. She is not diaphoretic.   HENT:      Head: Normocephalic and atraumatic.      Right Ear: Tympanic membrane, ear canal and external ear normal. There is no impacted cerumen.      Left Ear: Tympanic membrane, ear canal and external ear normal. There is no impacted cerumen.      Nose: Congestion present. No rhinorrhea.  Right Turbinates: Swollen.      Left Turbinates: Swollen.      Right Sinus: No maxillary sinus tenderness or frontal sinus tenderness.      Left Sinus: No maxillary sinus tenderness or frontal sinus tenderness.      Mouth/Throat:      Mouth: Mucous membranes are moist.      Pharynx: Oropharynx is clear. No pharyngeal swelling, oropharyngeal exudate, posterior oropharyngeal erythema or uvula swelling.   Eyes:      General: No scleral icterus.        Right eye: No discharge.         Left eye: No  discharge.      Extraocular Movements: Extraocular movements intact.      Conjunctiva/sclera: Conjunctivae normal.      Pupils: Pupils are equal, round, and reactive to light.   Neck:      Vascular: No carotid bruit.      Trachea: No tracheal deviation.   Cardiovascular:      Rate and Rhythm: Normal rate and regular rhythm.      Pulses: Normal pulses.      Heart sounds: Normal heart sounds. No murmur heard.   No friction rub. No gallop.    Pulmonary:      Effort: Pulmonary effort is normal. No respiratory distress.      Breath sounds: Normal breath sounds. No stridor. No wheezing, rhonchi or rales.   Chest:      Chest wall: No tenderness.   Abdominal:      General: Bowel sounds are normal. There is no distension.      Palpations: Abdomen is soft. There is no mass.      Tenderness: There is no abdominal tenderness. There is no guarding or rebound.      Hernia: No hernia is present.   Musculoskeletal:         General: No swelling, tenderness, deformity or signs of injury. Normal range of motion.      Cervical back: Normal range of motion and neck supple. No rigidity. No muscular tenderness.      Right lower leg: No edema.      Left lower leg: No edema.   Lymphadenopathy:      Cervical: No cervical adenopathy.   Skin:     General: Skin is warm and dry.      Capillary Refill: Capillary refill takes less than 2 seconds.      Coloration: Skin is not jaundiced or pale.      Findings: No bruising, erythema, lesion or rash.   Neurological:      General: No focal deficit present.      Mental Status: She is alert and oriented to person, place, and time. Mental status is at baseline.      Cranial Nerves: No cranial nerve deficit.      Sensory: No sensory deficit.      Motor: No weakness or abnormal muscle tone.      Coordination: Coordination normal.      Gait: Gait normal.      Deep Tendon Reflexes: Reflexes are normal and symmetric. Reflexes normal.   Psychiatric:         Mood and Affect: Mood is depressed.         Behavior:  Behavior normal.         Thought Content: Thought content normal.         Judgment: Judgment normal.      Comments: Tearful when discussing her  daughter who recently passed away          Abstract on 10/19/2019   Component Date Value Ref Range Status   ??? SARS-CoV-2 10/17/2019 negative   Final    PCR           Assessment & Plan:     The following diagnoses and conditions are stable with no further orders unless indicated:  1. Pneumonia of right middle lobe due to infectious organism    2. Acute UTI    3. Abnormal CT scan, liver    4. Bilateral flank pain    5. Diverticulosis        Christabelle was seen today for uri.    Juliya is feeling better since she has been taking the Levaquin.  Her cough has improved and she not having any more fevers.  She has not been having any abdominal pain and states that her urine has now been clear.  She is able to drink more.  She is not having any more headaches.  She states she does feel better with the Levaquin and denies any blood in her urine and states that her bilateral flank pain has resolved.  Recommend a repeat CBC and BMP to recheck her white blood count number along with her sodium level.  Discussed with her that she does need a colonoscopy as her CT scan revealed some possibility of diverticulosis and she needs further evaluation through a colonoscopy.  She was given 2 referrals.  She states she is going to [place] have see a GI provider up in [place].  Also recommended chest x-ray to be repeated in 6 to 8 weeks.  She states she will have to have this performed up in [place].  Informed of the chest x-ray is to verify resolution of her pneumonia.  She verbalized understanding.  Also recommend her getting a repeat CT of her abdomen to evaluate her liver further.  Informed her there is possibility of lesions or cyst on her liver.  She states that she has been told about these in the past.  Future CT of the abdomen pelvis ordered.  She is going to have this  also performed in [place].  She did complete her entire course of antibiotics.  She develops any worsening shortness of breath, chest pain, fever greater than 103, blood in her urine she is to go to the ER.  She verbalized understanding.    Diagnoses and all orders for this visit:    Pneumonia of right middle lobe due to infectious organism  -     CBC WITH AUTO DIFFERENTIAL  -     BASIC METABOLIC PANEL    Acute UTI    Abnormal CT scan, liver  -     CT ABDOMEN PELVIS W WO CONTRAST Additional Contrast? Radiologist Recommendation; Future    Bilateral flank pain    Diverticulosis  -     [facility]FL - Renella Cunas, MD, Gastroenterology, [facility] - Bronson Ing, MD, Gastroenterology, [place]      Prior to Visit Medications    Medication Sig Taking? Authorizing Provider   levoFLOXacin (LEVAQUIN) 500 MG tablet 1 daily by mouth Yes Tarri Fuller, APRN - CNP   albuterol sulfate HFA (VENTOLIN HFA) 108 (90 Base) MCG/ACT inhaler Inhale 2 puffs into the lungs 4 times daily as needed for Wheezing or Shortness of Breath Yes Tarri Fuller, APRN - CNP   methylPREDNISolone (MEDROL DOSEPACK)  4 MG tablet Use as directed per package instructions Yes Tarri Fuller, APRN - CNP   guaiFENesin (MUCINEX) 600 MG extended release tablet Take 1 tablet by mouth 2 times daily for 15 days Yes Tarri Fuller, APRN - CNP   venlafaxine (EFFEXOR XR) 150 MG extended release capsule Take 1 capsule by mouth daily Yes Tarri Fuller, APRN - CNP   conjugated estrogens (PREMARIN) 0.625 MG/GM vaginal cream APPLY A PEA SIZE AMOUNT TO URETHRA DAILY. Yes Tarri Fuller, APRN - CNP   atenolol (TENORMIN) 25 MG tablet TAKE 1 TABLET BY MOUTH EVERY DAY Yes Tarri Fuller, APRN - CNP   ergocalciferol (ERGOCALCIFEROL) 1.25 MG (50000 UT) capsule Take 1 capsule by mouth once a week Yes Tarri Fuller, APRN - CNP   valACYclovir (VALTREX) 1 g tablet TAKE 2 TABLETS IN THE AM AND PM ONCE AT THE  ONSET OF A COLD SORE Yes Denice Bors, MD   sodium chloride 0.9 % SOLN with HYDROmorphone HCl PF 50 MG/5ML SOLN 0.2 mg/mL Infuse 0.5 mg/hr intravenously continuous. Indications: pt has pain pump Yes Historical Provider, MD   cyclobenzaprine (FLEXERIL) 10 MG tablet  Yes Historical Provider, MD     No orders of the defined types were placed in this encounter.        Return in about 2 weeks (around 11/02/2019) for Pneumonia/UTI follow up .    Patient should call the office immediately with new or ongoing signs or symptoms or worsening, or proceedto the emergency room.  No changes in past medical history, past surgical history, social history, or family history were noted during the patient encounter unless specifically listed above.  All updates of past medicalhistory, past surgical history, social history, or family history were reviewed personally by me during the office visit.  All problems listed in the assessment are stable unless noted otherwise.  Medication profilereviewed personally by me during the office visit.  Medication side effects and possible impairments from medications were discussed as applicable.     Call if pattern of symptoms change or persists for an extended time.    This document was prepared by a combination of typing and transcription through a voice recognition software.    All medications have the potential for adverse effects. All medications effect each person differently. Please read and review provided information related to medication. If the medication that you have been prescribed has the potential to cause sedation, do not drive or operate car, truck, or heavy machinery until you know how the medication will effect you. If you experience any adverse effects from the medication, please call the office or report to the emergency department.

## 2019-10-23 ENCOUNTER — Encounter: Admit: 2019-10-23 | Discharge: 2019-10-23 | Payer: BLUE CROSS/BLUE SHIELD

## 2019-10-24 LAB — CBC WITH AUTO DIFFERENTIAL
Basophils %: 3 %
Basophils Absolute: 0.4 10*3/uL — ABNORMAL HIGH (ref 0.0–0.2)
Eosinophils %: 0 %
Eosinophils Absolute: 0 10*3/uL (ref 0.0–0.6)
Hematocrit: 38.2 % (ref 36.0–48.0)
Hemoglobin: 13.2 g/dL (ref 12.0–16.0)
Lymphocytes %: 35 %
Lymphocytes Absolute: 4.4 10*3/uL (ref 1.0–5.1)
MCH: 32.6 pg (ref 26.0–34.0)
MCHC: 34.6 g/dL (ref 31.0–36.0)
MCV: 94.2 fL (ref 80.0–100.0)
MPV: 7.8 fL (ref 5.0–10.5)
Monocytes %: 7 %
Monocytes Absolute: 0.9 10*3/uL (ref 0.0–1.3)
Myelocyte Percent: 1 % — AB
Neutrophils %: 54 %
Neutrophils Absolute: 6.9 10*3/uL (ref 1.7–7.7)
Platelets: 630 10*3/uL — ABNORMAL HIGH (ref 135–450)
RBC Morphology: NORMAL
RBC: 4.06 M/uL (ref 4.00–5.20)
RDW: 14.8 % (ref 12.4–15.4)
WBC: 12.5 10*3/uL — ABNORMAL HIGH (ref 4.0–11.0)

## 2019-10-24 LAB — BASIC METABOLIC PANEL
Anion Gap: 16 (ref 3–16)
BUN: 11 mg/dL (ref 7–20)
CO2: 24 mmol/L (ref 21–32)
Calcium: 9.4 mg/dL (ref 8.3–10.6)
Chloride: 100 mmol/L (ref 99–110)
Creatinine: 0.7 mg/dL (ref 0.6–1.1)
GFR African American: >60 (ref >60)
GFR Non-African American: >60 (ref >60)
Glucose: 122 mg/dL — ABNORMAL HIGH (ref 70–99)
Potassium: 4.8 mmol/L (ref 3.5–5.1)
Sodium: 140 mmol/L (ref 136–145)

## 2019-10-26 ENCOUNTER — Encounter

## 2019-10-26 LAB — HEMOGLOBIN A1C
Hemoglobin A1C: 6 %
eAG: 125.5 mg/dL

## 2020-01-16 ENCOUNTER — Encounter

## 2020-02-03 ENCOUNTER — Encounter

## 2020-02-03 MED ORDER — VALACYCLOVIR HCL 1 G PO TABS
1 g | ORAL_TABLET | ORAL | 2 refills | Status: AC
Start: 2020-02-03 — End: ?

## 2020-02-03 NOTE — Telephone Encounter (Signed)
Last appointment 10/19/19

## 2020-03-14 ENCOUNTER — Encounter

## 2020-03-14 MED ORDER — VENLAFAXINE HCL ER 150 MG PO CP24
150 MG | ORAL_CAPSULE | ORAL | 0 refills | Status: DC
Start: 2020-03-14 — End: 2020-09-22

## 2020-07-17 ENCOUNTER — Encounter

## 2020-07-18 NOTE — Telephone Encounter (Signed)
Patient moved and is seeing new provider

## 2020-07-20 ENCOUNTER — Encounter

## 2020-07-20 NOTE — Telephone Encounter (Signed)
No longer out pt

## 2020-07-27 ENCOUNTER — Encounter

## 2020-09-22 ENCOUNTER — Encounter

## 2020-09-22 MED ORDER — VENLAFAXINE HCL ER 75 MG PO CP24
75 MG | ORAL_CAPSULE | ORAL | 0 refills | Status: AC
Start: 2020-09-22 — End: ?

## 2020-09-22 MED ORDER — ATENOLOL 25 MG PO TABS
25 MG | ORAL_TABLET | ORAL | 0 refills | Status: AC
Start: 2020-09-22 — End: ?

## 2020-09-22 NOTE — Telephone Encounter (Signed)
I can refill 90 day Rx so she has time to find new PCP.     Please route other Rx to this provider.

## 2020-09-22 NOTE — Telephone Encounter (Signed)
Tanya Velasquez  [facility] Support Staff  Subject: Message to Provider     QUESTIONS   Information for Provider? REFILL REQUEST? patient move to [place]   due to family health, requesting refill for medication listed. VENLAFAXINE   150MG  request if can reduce TO 75 MG if possible if not thats ok 1 a DAY 4   days left ATENOLOL 25 MG 1 time a day no days left [facility] [address],   [place] PHONE NUMBER [phone] per patient stated that   [phone] would help with the prescription through this year.   ---------------------------------------------------------------------------   --------------   CALL BACK INFO   What is the best way for the office to contact you? OK to leave message on   voicemail, OK to respond with electronic message via MyChart portal (only   for patients who have registered MyChart account)   Preferred Call Back Phone Number? [name]   ---------------------------------------------------------------------------   --------------   SCRIPT ANSWERS   Relationship to Patient? Self

## 2020-12-20 ENCOUNTER — Encounter

## 2020-12-20 NOTE — Telephone Encounter (Signed)
Patient moved and was informed she needed new PCP

## 2021-10-17 NOTE — Telephone Encounter (Signed)
Imported certified medical records for 04/15/08 to date into [place] for [facility]    No PT on file

## 2022-03-30 ENCOUNTER — Ambulatory Visit: Payer: BC Managed Care – PPO | Admitting: Internal Medicine

## 2022-04-25 ENCOUNTER — Encounter: Payer: Self-pay | Admitting: Internal Medicine

## 2022-04-25 ENCOUNTER — Ambulatory Visit: Payer: BC Managed Care – PPO | Admitting: Internal Medicine

## 2022-04-25 VITALS — BP 110/68 | HR 64 | Temp 97.6°F | Resp 16 | Ht 66.0 in | Wt 169.0 lb

## 2022-04-25 DIAGNOSIS — J40 Bronchitis, not specified as acute or chronic: Secondary | ICD-10-CM | POA: Diagnosis not present

## 2022-04-25 DIAGNOSIS — J029 Acute pharyngitis, unspecified: Secondary | ICD-10-CM | POA: Diagnosis not present

## 2022-04-25 MED ORDER — AMOXICILLIN-POT CLAVULANATE 875-125 MG PO TABS: 1.0000 | ORAL_TABLET | Freq: Two times a day (BID) | ORAL | 0 refills | Status: DC

## 2022-04-25 MED ORDER — FLUCONAZOLE 150 MG PO TABS: 150.0000 mg | ORAL_TABLET | Freq: Once | ORAL | 0 refills | Status: AC

## 2022-04-25 NOTE — Progress Notes (Signed)
Office Visit  Subjective   Patient ID: Autumn Murray   DOB: 11/14/1965   Age: 56 y.o.   MRN: [id number]   Chief Complaint Chief Complaint  Patient presents with   Acute Visit    Yeast infection, cough     History of Present Illness The patient is a 56 year old Caucasian/White female who presents with upper respiratory tract symptoms which began 6 weeks ago.  This initially started with sinus congestion without nasal discharge with post nasal drip with sore throat as well as nonproductive cough.  She states this moved down into her chest with chest congestion with cough productive of yellow sputum.  She states she has mild wheezing when she lies down.  She did home testing for COVID-19 and this was negative.  Today, she ish having headaches with myalgias.  She denies any fevers, chills, shortness of breath, weakness, nausea, vomiting, or other problems. Alleviating factors: Vitamin C and tylenol.  She has not had any of the COVID-19 vaccines.   The patient's past medical history is notable for a history of tobacco abuse and allergies.        Past Medical History Past Medical History:  Diagnosis Date   Arthritis    Depression    Hypertension      Allergies No Known Allergies   Review of Systems Review of Systems  Constitutional:  Negative for chills and fever.  HENT:  Positive for congestion and sore throat. Negative for ear pain and hearing loss.   Eyes:  Negative for blurred vision and double vision.  Respiratory:  Positive for cough, sputum production and wheezing. Negative for shortness of breath.   Cardiovascular:  Negative for chest pain and palpitations.  Gastrointestinal:  Negative for abdominal pain, constipation, diarrhea, nausea and vomiting.  Musculoskeletal:  Positive for myalgias.  Skin:  Negative for itching and rash.  Neurological:  Positive for headaches. Negative for dizziness and weakness.       Objective:    Vitals BP 110/68   Pulse 64   Temp  97.6 F (36.4 C)   Resp 16   Ht 5\' 6"  (1.676 m)   Wt 169 lb 0.6 oz (76.7 kg)   SpO2 95%   BMI 27.28 kg/m    Physical Examination Physical Exam HENT:     Right Ear: Tympanic membrane, ear canal and external ear normal.     Left Ear: Tympanic membrane, ear canal and external ear normal.     Nose: Congestion present.     Mouth/Throat:     Mouth: Mucous membranes are moist.     Pharynx: Oropharyngeal exudate and posterior oropharyngeal erythema present.  Cardiovascular:     Rate and Rhythm: Normal rate and regular rhythm.     Pulses: Normal pulses.     Heart sounds: No murmur heard.    No friction rub. No gallop.  Pulmonary:     Effort: Pulmonary effort is normal. No respiratory distress.     Breath sounds: No wheezing, rhonchi or rales.  Abdominal:     General: Abdomen is flat. Bowel sounds are normal. There is no distension.     Palpations: Abdomen is soft.     Tenderness: There is no abdominal tenderness.  Musculoskeletal:     Right lower leg: No edema.     Left lower leg: No edema.  Skin:    General: Skin is warm and dry.     Findings: No rash.  Assessment & Plan:   Sore throat She has tonsillar swelling with erythema and exudates.  We did a rapid strep test and this was negative.  Bronchitis We did rapid covid and flu tests and these were negative.  I think she has bronchitis and we will start her on augmentin.  Continue supportive care.    No follow-ups on file.   Crist Fat, MD

## 2022-04-25 NOTE — Assessment & Plan Note (Signed)
We did rapid covid and flu tests and these were negative.  I think she has bronchitis and we will start her on augmentin.  Continue supportive care.

## 2022-04-25 NOTE — Assessment & Plan Note (Signed)
She has tonsillar swelling with erythema and exudates.  We did a rapid strep test and this was negative.

## 2022-05-15 ENCOUNTER — Other Ambulatory Visit: Payer: Self-pay

## 2022-05-15 MED ORDER — ATORVASTATIN CALCIUM 40 MG PO TABS: 40.0000 mg | ORAL_TABLET | Freq: Every day | ORAL | 1 refills | Status: DC

## 2022-05-15 MED ORDER — ATENOLOL 25 MG PO TABS: 25.0000 mg | ORAL_TABLET | Freq: Every day | ORAL | 1 refills | Status: DC

## 2022-05-24 ENCOUNTER — Other Ambulatory Visit: Payer: Self-pay

## 2022-05-24 MED ORDER — ESCITALOPRAM OXALATE 20 MG PO TABS: 20.0000 mg | ORAL_TABLET | Freq: Every day | ORAL | 1 refills | Status: DC

## 2022-06-05 ENCOUNTER — Ambulatory Visit: Payer: BC Managed Care – PPO | Admitting: Internal Medicine

## 2022-06-05 ENCOUNTER — Encounter: Payer: Self-pay | Admitting: Internal Medicine

## 2022-06-05 VITALS — BP 130/88 | HR 90 | Temp 97.1°F | Resp 16 | Ht 66.0 in | Wt 170.0 lb

## 2022-06-05 DIAGNOSIS — J40 Bronchitis, not specified as acute or chronic: Secondary | ICD-10-CM

## 2022-06-05 DIAGNOSIS — I10 Essential (primary) hypertension: Secondary | ICD-10-CM | POA: Diagnosis not present

## 2022-06-05 DIAGNOSIS — F330 Major depressive disorder, recurrent, mild: Secondary | ICD-10-CM

## 2022-06-05 MED ORDER — FLUCONAZOLE 150 MG PO TABS: 150.0000 mg | ORAL_TABLET | Freq: Once | ORAL | 0 refills | Status: AC

## 2022-06-05 NOTE — Assessment & Plan Note (Signed)
She has probable viral bronchitis.  She had negative covid and flu antigen testing today.  I want her to continue supportive care and if she starts developing fevers or cough productive of colored sputum, she is to call back.

## 2022-06-05 NOTE — Assessment & Plan Note (Signed)
Her BP is doing well and is controlled.  We will continue with atenolol.

## 2022-06-05 NOTE — Assessment & Plan Note (Signed)
Her GAD and depression are currently stable and controlled.

## 2022-06-05 NOTE — Progress Notes (Signed)
Office Visit  Subjective   Patient ID: Autumn Murray   DOB: 1965/07/21   Age: 57 y.o.   MRN: [id number]   Chief Complaint Chief Complaint  Patient presents with   Follow-up    hypertension     History of Present Illness The patient is a 57 year old Caucasian/White female who presents with upper respiratory tract symptoms which began 2 weeks ago.  This initially started with a sore throat and then this moved into her chest where she is having chest congestion with cough productive of clear sputum. She denies any fevers, chills, myalgias, shortness of breath, wheezing, weakness, nausea, vomiting, or other problems. Alleviating factors: Vitamin C and corcidin HBP.  She has not had any of the COVID-19 vaccines. The patient's past medical history is notable for a history of tobacco abuse and allergies.   The patient returns today for followup of her depression and anxiety.  This past year I felt her depression was a bit worse but her anxiety was controlled.  I increased her Lexapro from 10mg  to 20mg  daily and today she states her depression is stable.  Today, she states her depression and anxiety are both controlled.  She was diagnosed with depression and anxiety in the year 2000. She has been on Lexapro and Effexor in the past.  She is having panic attacks 2 times a week. She also reports difficulty concentrating, fatigue, feelings of isolation, social withdrawal, loss of interest in pleasurable activities, and out of control feelings. She denies difficulty performing routine daily activities, extreme feelings of guilt, feelings of worthlessness, helpless feeling, suicidal ideation, homicidal ideation, weight loss, insomnia, and loss of appetite. This patient feels that she is able to care for herself. She currently lives with her husband and mother in law that has dementia.. She has no significant prior history of mental health disorders.   The patient is a 57 year old Caucasian/White female who  presents for a follow-up evaluation of hypertension. She was diagnosed with HTN around 2007.  The patient has not been checking her blood pressure at home. The patient's current medications include: atenolol 25mg  daily. The patient has been tolerating her medications well. The patient denies any headache, visual changes, dizziness, lightheadness, chest pain, shortness of breath, weakness/numbness, and edema.                           Past Medical History Past Medical History:  Diagnosis Date   Arthritis    Depression    Hypertension      Allergies No Known Allergies   Medications  Current Outpatient Medications:    atenolol (TENORMIN) 25 MG tablet, Take 1 tablet (25 mg total) by mouth daily., Disp: 90 tablet, Rfl: 1   atorvastatin (LIPITOR) 40 MG tablet, Take 1 tablet (40 mg total) by mouth daily., Disp: 90 tablet, Rfl: 1   escitalopram (LEXAPRO) 20 MG tablet, Take 1 tablet (20 mg total) by mouth daily., Disp: 90 tablet, Rfl: 1   Review of Systems Review of Systems  Constitutional:  Negative for chills and fever.  Eyes:  Negative for blurred vision and double vision.  Respiratory:  Positive for cough and sputum production. Negative for shortness of breath and wheezing.   Cardiovascular:  Negative for chest pain, palpitations and leg swelling.  Gastrointestinal:  Negative for abdominal pain, constipation, diarrhea, nausea and vomiting.  Musculoskeletal:  Negative for myalgias.  Skin:  Negative for itching and rash.  Neurological:  Negative for dizziness, weakness and headaches.  Psychiatric/Behavioral:  Negative for suicidal ideas. The patient does not have insomnia.        Objective:    Vitals BP 130/88   Pulse 90   Temp (!) 97.1 F (36.2 C)   Resp 16   Ht 5\' 6"  (1.676 m)   Wt 170 lb (77.1 kg)   SpO2 97%   BMI 27.44 kg/m    Physical Examination Physical Exam Constitutional:      Appearance: Normal appearance. She is not ill-appearing.   Cardiovascular:     Rate and Rhythm: Normal rate and regular rhythm.     Pulses: Normal pulses.     Heart sounds: No murmur heard.    No friction rub. No gallop.  Pulmonary:     Effort: Pulmonary effort is normal. No respiratory distress.     Breath sounds: No wheezing, rhonchi or rales.  Abdominal:     General: Abdomen is flat. Bowel sounds are normal. There is no distension.     Palpations: Abdomen is soft.     Tenderness: There is no abdominal tenderness.  Musculoskeletal:     Right lower leg: No edema.     Left lower leg: No edema.  Skin:    General: Skin is warm and dry.     Findings: No rash.  Neurological:     General: No focal deficit present.     Mental Status: She is alert and oriented to person, place, and time.  Psychiatric:        Mood and Affect: Mood normal.        Behavior: Behavior normal.        Assessment & Plan:   Essential hypertension Her BP is doing well and is controlled.  We will continue with atenolol.  Bronchitis She has probable viral bronchitis.  She had negative covid and flu antigen testing today.  I want her to continue supportive care and if she starts developing fevers or cough productive of colored sputum, she is to call back.  Mild episode of recurrent major depressive disorder (HCC) Her GAD and depression are currently stable and controlled.    Return in about 3 months (around 09/04/2022) for annual.   Townsend Roger, MD

## 2022-06-08 ENCOUNTER — Other Ambulatory Visit: Payer: Self-pay | Admitting: Internal Medicine

## 2022-06-08 MED ORDER — AMOXICILLIN 500 MG PO CAPS: 500.0000 mg | ORAL_CAPSULE | Freq: Three times a day (TID) | ORAL | 0 refills | Status: DC

## 2022-07-30 ENCOUNTER — Encounter: Payer: Self-pay | Admitting: Internal Medicine

## 2022-07-30 ENCOUNTER — Ambulatory Visit: Payer: BC Managed Care – PPO | Admitting: Internal Medicine

## 2022-07-30 VITALS — BP 108/70 | HR 52 | Temp 97.2°F | Resp 16 | Ht 66.0 in | Wt 172.8 lb

## 2022-07-30 DIAGNOSIS — B3731 Acute candidiasis of vulva and vagina: Secondary | ICD-10-CM | POA: Diagnosis not present

## 2022-07-30 MED ORDER — FLUCONAZOLE 150 MG PO TABS: ORAL_TABLET | ORAL | 0 refills | Status: DC

## 2022-07-30 MED ORDER — PREMARIN 0.625 MG/GM VA CREA: 1.0000 | TOPICAL_CREAM | Freq: Every day | VAGINAL | 0 refills | Status: AC | PRN

## 2022-07-30 NOTE — Assessment & Plan Note (Addendum)
We will give her floconazole oral for her yeast infection.  She uses premarin cream for vaginal intercourse.  She only uses this a few times per month.  I had a discussion about the risks of premarin/hormonal treatment and she understands.

## 2022-07-30 NOTE — Progress Notes (Signed)
   Office Visit  Subjective   Patient ID: Autumn Murray   DOB: 08/29/1965   Age: 57 y.o.   MRN: [id number]   Chief Complaint Chief Complaint  Patient presents with   Acute Visit    Yeast infection     History of Present Illness The patient is a 57 yo female who comes in today with complaints of a vaginal yeast infection.  This has been going on since she received antibiotics for her bronchitis in 05/2022.  She is not having any itching but just a yeast discharge.  There is no fevers, chills, abdominal pain, n/v, urinary frequency, hematuria, dysuria or other urinary problems.  She has tried [employer].     Past Medical History Past Medical History:  Diagnosis Date   Arthritis    Depression    Hypertension      Allergies No Known Allergies   Medications  Current Outpatient Medications:    atenolol (TENORMIN) 25 MG tablet, Take 1 tablet (25 mg total) by mouth daily., Disp: 90 tablet, Rfl: 1   atorvastatin (LIPITOR) 40 MG tablet, Take 1 tablet (40 mg total) by mouth daily., Disp: 90 tablet, Rfl: 1   escitalopram (LEXAPRO) 20 MG tablet, Take 1 tablet (20 mg total) by mouth daily., Disp: 90 tablet, Rfl: 1   Review of Systems Review of Systems  Constitutional:  Negative for chills and fever.  Respiratory:  Negative for cough and shortness of breath.   Gastrointestinal:  Positive for heartburn. Negative for abdominal pain, constipation, diarrhea, nausea and vomiting.  Genitourinary:  Negative for dysuria, frequency, hematuria and urgency.  Musculoskeletal:  Negative for myalgias.  Skin:  Negative for itching and rash.       Objective:    Vitals BP 108/70   Pulse (!) 52   Temp (!) 97.2 F (36.2 C)   Resp 16   Ht 5\' 6"  (1.676 m)   Wt 172 lb 12.8 oz (78.4 kg)   SpO2 93%   BMI 27.89 kg/m    Physical Examination Physical Exam Constitutional:      Appearance: Normal appearance. She is not ill-appearing.  Cardiovascular:     Rate and Rhythm: Normal rate and regular  rhythm.     Pulses: Normal pulses.     Heart sounds: No murmur heard.    No friction rub. No gallop.  Pulmonary:     Effort: Pulmonary effort is normal. No respiratory distress.     Breath sounds: No wheezing, rhonchi or rales.  Abdominal:     General: Bowel sounds are normal. There is no distension.     Palpations: Abdomen is soft.     Tenderness: There is no abdominal tenderness.  Musculoskeletal:     Right lower leg: No edema.     Left lower leg: No edema.  Skin:    General: Skin is warm and dry.     Findings: No rash.  Neurological:     Mental Status: She is alert.        Assessment & Plan:   Vaginal yeast infection We will give her floconazole oral for her yeast infection.  She uses premarin cream for vaginal intercourse.  She only uses this a few times per month.  I had a discussion about the risks of premarin/hormonal treatment and she understands.    No follow-ups on file.   Townsend Roger, MD

## 2022-09-12 ENCOUNTER — Encounter: Payer: BC Managed Care – PPO | Admitting: Internal Medicine

## 2022-10-19 ENCOUNTER — Other Ambulatory Visit: Payer: Self-pay | Admitting: Internal Medicine

## 2023-09-10 ENCOUNTER — Other Ambulatory Visit: Payer: Self-pay | Admitting: Internal Medicine

## 2023-12-06 ENCOUNTER — Encounter: Payer: Self-pay | Admitting: Internal Medicine

## 2023-12-06 ENCOUNTER — Ambulatory Visit: Admitting: Internal Medicine

## 2023-12-06 VITALS — BP 132/82 | HR 57 | Temp 97.7°F | Resp 16 | Ht 66.0 in | Wt 162.0 lb

## 2023-12-06 DIAGNOSIS — J40 Bronchitis, not specified as acute or chronic: Secondary | ICD-10-CM

## 2023-12-06 MED ORDER — ALBUTEROL SULFATE HFA 108 (90 BASE) MCG/ACT IN AERS: 1.0000 | INHALATION_SPRAY | Freq: Four times a day (QID) | RESPIRATORY_TRACT | 0 refills | Status: AC | PRN

## 2023-12-06 MED ORDER — DOXYCYCLINE MONOHYDRATE 100 MG PO CAPS: 100.0000 mg | ORAL_CAPSULE | Freq: Two times a day (BID) | ORAL | 0 refills | Status: AC

## 2023-12-06 MED ORDER — FLUCONAZOLE 150 MG PO TABS: ORAL_TABLET | ORAL | 0 refills | Status: AC

## 2023-12-06 NOTE — Progress Notes (Signed)
 Office Visit  Subjective   Patient ID: Autumn Murray   DOB: 08-27-1965   Age: 58 y.o.   MRN: [id number]   Chief Complaint No chief complaint on file.    History of Present Illness The patient is a 58 yo female who comes in today for an acute visit due to respiratory infection.  She states she began having a productive cough of yellow over the last month. She denies any sinus or chest congestion and no SOB.  She is not having any sinus discharge, post nasal drip, sore throat, fevers, chills, but no nausea, vomiting, or diarrhea.  She also has associated myalgias with headaches.  There has been no rash or tick bites.  She has been taking a herbal supplement to help but there has been no relief.     Past Medical History Past Medical History:  Diagnosis Date   Arthritis    Depression    Hypertension      Allergies No Known Allergies   Medications  Current Outpatient Medications:    atenolol  (TENORMIN ) 25 MG tablet, TAKE 1 TABLET BY MOUTH DAILY, Disp: 90 tablet, Rfl: 3   atorvastatin  (LIPITOR) 40 MG tablet, TAKE 1 TABLET BY MOUTH DAILY, Disp: 90 tablet, Rfl: 3   conjugated estrogens  (PREMARIN ) vaginal cream, Place 1 Applicatorful vaginally daily as needed., Disp: 30 g, Rfl: 0   escitalopram  (LEXAPRO ) 20 MG tablet, TAKE 1 TABLET BY MOUTH DAILY, Disp: 90 tablet, Rfl: 3   fluconazole  (DIFLUCAN ) 150 MG tablet, Take one tab po x 1, repeat again in 72 hours, Disp: 2 tablet, Rfl: 0   Review of Systems Review of Systems  Constitutional:  Positive for malaise/fatigue. Negative for chills and fever.  Respiratory:  Positive for cough, sputum production and wheezing. Negative for shortness of breath.   Cardiovascular:  Negative for chest pain, palpitations and leg swelling.  Gastrointestinal:  Negative for abdominal pain, constipation, diarrhea, nausea and vomiting.  Musculoskeletal:  Positive for myalgias.  Neurological:  Positive for headaches.       Objective:    Vitals BP  132/82   Pulse (!) 57   Temp 97.7 F (36.5 C) (Other (Comment))   Resp 16   Ht 5' 6 (1.676 m)   Wt 162 lb (73.5 kg)   SpO2 95%   BMI 26.15 kg/m    Physical Examination Physical Exam Constitutional:      Appearance: Normal appearance. She is not ill-appearing.  Cardiovascular:     Rate and Rhythm: Normal rate and regular rhythm.     Pulses: Normal pulses.     Heart sounds: No murmur heard.    No friction rub. No gallop.  Pulmonary:     Effort: Pulmonary effort is normal. No respiratory distress.     Breath sounds: No wheezing, rhonchi or rales.  Abdominal:     General: Bowel sounds are normal. There is no distension.     Palpations: Abdomen is soft.     Tenderness: There is no abdominal tenderness.  Musculoskeletal:     Right lower leg: No edema.     Left lower leg: No edema.  Skin:    General: Skin is warm and dry.     Findings: No rash.  Neurological:     Mental Status: She is alert.        Assessment & Plan:   Bronchitis I hear no wheezes or rales on exam today.  We will start her on doxycycline and flovent HFA.  Continue  supportive care.  Her rapid covid testing was negative.    No follow-ups on file.   Selinda Fleeta Finger, MD

## 2023-12-06 NOTE — Assessment & Plan Note (Addendum)
 I hear no wheezes or rales on exam today.  We will start her on doxycycline and flovent HFA.  Continue supportive care.  Her rapid covid testing was negative.

## 2024-04-20 ENCOUNTER — Encounter: Admitting: Internal Medicine
